# Patient Record
Sex: Female | Born: 1955 | Race: White | Hispanic: No | Smoking: Never smoker
Health system: Southern US, Community
[De-identification: ages and names within clinical notes are randomized; demographics above are authoritative.]

## PROBLEM LIST (undated history)

## (undated) DIAGNOSIS — H9319 Tinnitus, unspecified ear: Secondary | ICD-10-CM

## (undated) DIAGNOSIS — F419 Anxiety disorder, unspecified: Secondary | ICD-10-CM

## (undated) DIAGNOSIS — G4730 Sleep apnea, unspecified: Secondary | ICD-10-CM

## (undated) DIAGNOSIS — B0230 Zoster ocular disease, unspecified: Secondary | ICD-10-CM

## (undated) DIAGNOSIS — E559 Vitamin D deficiency, unspecified: Secondary | ICD-10-CM

## (undated) DIAGNOSIS — G43909 Migraine, unspecified, not intractable, without status migrainosus: Secondary | ICD-10-CM

## (undated) DIAGNOSIS — B019 Varicella without complication: Secondary | ICD-10-CM

## (undated) HISTORY — DX: Anxiety disorder, unspecified: F41.9

## (undated) HISTORY — DX: Migraine, unspecified, not intractable, without status migrainosus: G43.909

## (undated) HISTORY — DX: Zoster ocular disease, unspecified: B02.30

## (undated) HISTORY — DX: Vitamin D deficiency, unspecified: E55.9

## (undated) HISTORY — DX: Sleep apnea, unspecified: G47.30

## (undated) HISTORY — DX: Varicella without complication: B01.9

## (undated) HISTORY — DX: Tinnitus, unspecified ear: H93.19

---

## 1976-10-26 HISTORY — PX: APPENDECTOMY: SHX54

## 2004-10-26 HISTORY — PX: CHOLECYSTECTOMY, LAPAROSCOPIC: SHX56

## 2008-10-26 HISTORY — PX: ABLATION: SHX5711

## 2019-12-05 ENCOUNTER — Ambulatory Visit (INDEPENDENT_AMBULATORY_CARE_PROVIDER_SITE_OTHER): Payer: 59 | Admitting: Primary Care

## 2019-12-05 ENCOUNTER — Encounter: Payer: Self-pay | Admitting: Primary Care

## 2019-12-05 ENCOUNTER — Other Ambulatory Visit: Payer: Self-pay

## 2019-12-05 VITALS — BP 118/82 | HR 90 | Temp 97.0°F | Ht 65.0 in | Wt 184.2 lb

## 2019-12-05 DIAGNOSIS — H9313 Tinnitus, bilateral: Secondary | ICD-10-CM

## 2019-12-05 DIAGNOSIS — N898 Other specified noninflammatory disorders of vagina: Secondary | ICD-10-CM

## 2019-12-05 DIAGNOSIS — B0230 Zoster ocular disease, unspecified: Secondary | ICD-10-CM | POA: Diagnosis not present

## 2019-12-05 DIAGNOSIS — G609 Hereditary and idiopathic neuropathy, unspecified: Secondary | ICD-10-CM | POA: Diagnosis not present

## 2019-12-05 DIAGNOSIS — H9319 Tinnitus, unspecified ear: Secondary | ICD-10-CM | POA: Insufficient documentation

## 2019-12-05 DIAGNOSIS — E559 Vitamin D deficiency, unspecified: Secondary | ICD-10-CM | POA: Diagnosis not present

## 2019-12-05 LAB — COMPREHENSIVE METABOLIC PANEL
ALT: 11 U/L (ref 0–35)
AST: 14 U/L (ref 0–37)
Albumin: 4.3 g/dL (ref 3.5–5.2)
Alkaline Phosphatase: 67 U/L (ref 39–117)
BUN: 15 mg/dL (ref 6–23)
CO2: 31 mEq/L (ref 19–32)
Calcium: 9.3 mg/dL (ref 8.4–10.5)
Chloride: 103 mEq/L (ref 96–112)
Creatinine, Ser: 0.81 mg/dL (ref 0.40–1.20)
GFR: 71.47 mL/min (ref >60.00)
Glucose, Bld: 99 mg/dL (ref 70–99)
Potassium: 4.5 mEq/L (ref 3.5–5.1)
Sodium: 138 mEq/L (ref 135–145)
Total Bilirubin: 0.4 mg/dL (ref 0.2–1.2)
Total Protein: 6.6 g/dL (ref 6.0–8.3)

## 2019-12-05 LAB — LIPID PANEL
Cholesterol: 217 mg/dL — ABNORMAL HIGH (ref 0–200)
HDL: 74.2 mg/dL (ref >39.00)
LDL Cholesterol: 116 mg/dL — ABNORMAL HIGH (ref 0–99)
NonHDL: 142.52
Total CHOL/HDL Ratio: 3
Triglycerides: 133 mg/dL (ref 0.0–149.0)
VLDL: 26.6 mg/dL (ref 0.0–40.0)

## 2019-12-05 LAB — CBC
HCT: 42.4 % (ref 36.0–46.0)
Hemoglobin: 14.2 g/dL (ref 12.0–15.0)
MCHC: 33.4 g/dL (ref 30.0–36.0)
MCV: 96.5 fl (ref 78.0–100.0)
Platelets: 252 10*3/uL (ref 150.0–400.0)
RBC: 4.39 Mil/uL (ref 3.87–5.11)
RDW: 12.5 % (ref 11.5–15.5)
WBC: 5.4 10*3/uL (ref 4.0–10.5)

## 2019-12-05 LAB — VITAMIN D 25 HYDROXY (VIT D DEFICIENCY, FRACTURES): VITD: 74.19 ng/mL (ref 30.00–100.00)

## 2019-12-05 NOTE — Assessment & Plan Note (Signed)
Chronic, following with ophthalmology. Continue current regimen.

## 2019-12-05 NOTE — Assessment & Plan Note (Signed)
External. Chronic. Doing well on prn triamcinolone 0.5% cream. Continue to monitor.   Consider vaginal Estrace cream for atrophy causing itching.

## 2019-12-05 NOTE — Assessment & Plan Note (Signed)
Compliant to 5000 units daily. Repeat Vitamin D lab pending.

## 2019-12-05 NOTE — Progress Notes (Signed)
Subjective:    Patient ID: Chelsea Long, female    DOB: July 19, 1956, 64 y.o.   MRN: [id number]  HPI  This visit occurred during the SARS-CoV-2 public health emergency.  Safety protocols were in place, including screening questions prior to the visit, additional usage of staff PPE, and extensive cleaning of exam room while observing appropriate contact time as indicated for disinfecting solutions.   Chelsea Long is a 64 year old female who presents today to establish care and discuss the problems mentioned below. Will obtain/review records.  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 Currently managed on prednisolone and Combigan drops. Following with ophthalmology at [facility]. Also managed on daily oral valacyclovir 500 mg.   2) Labial Itching: Chronic and intermittent for years. Managed on triamcinolone 0.5% cream for which she is using as needed, 1-2 times weekly. She's not sure if she was diagnosed with lichen sclerosis. Overall doing well on current regimen.   3) Vitamin D Deficiency: Currently managed on 5000 units daily, previously managed on 10,000 units daily. No recent vitamin D check.  4) Tinnitus: Chronic over the last several months, mostly constant but waxes and wanes. Has noticed increased cerumen production and is able to dislodge on her own.   5) Peripheral Neuropathy: Diagnosed around 6 years ago, was tried on several medications which caused intolerable side effects. Several years ago she saw a homeopathic provider, lost weight, exercised and changed her diet with improvement.   Neuropathy starts at the top of her body and works down to her toes. Toes are most bothersome. She plans on working on weight loss through diet and exercise.   BP Readings from Last 3 Encounters:  12/05/19 118/82     Review of Systems  HENT: Positive for tinnitus.   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   Cardiovascular: Negative for chest pain.  Genitourinary:       Intermittent external vaginal itching  Neurological:       Chronic peripheral neuropathy        Past Medical History:  Diagnosis Date  . Chicken pox   . Herpes zoster of eye   . Migraines   . Tinnitus   . Vitamin D deficiency      Social History   Socioeconomic History  . Marital status: Widowed    Spouse name: Not on file  . Number of children: Not on file  . Years of education: Not on file  . Highest education level: Not on file  Occupational History  . Not on file  Tobacco Use  . Smoking status: Never Smoker  . Smokeless tobacco: Never Used  Substance and Sexual Activity  . Alcohol use: Yes  . Drug use: Not on file  . Sexual activity: Not on file  Other Topics Concern  . Not on file  Social History Narrative  . Not on file   Social Determinants of Health   Financial Resource Strain:   . Difficulty of Paying Living Expenses: Not on file  Food Insecurity:   . Worried About Charity fundraiser in the Last Year: Not on file  . Ran Out of Food in the Last Year: Not on file  Transportation Needs:   . Lack of Transportation (Medical): Not on file  . Lack of Transportation (Non-Medical): Not on file  Physical Activity:   .  Days of Exercise per Week: Not on file  . Minutes of Exercise per Session: Not on file  Stress:   . Feeling of Stress : Not on file  Social Connections:   . Frequency of Communication with Friends and Family: Not on file  . Frequency of Social Gatherings with Friends and Family: Not on file  . Attends Religious Services: Not on file  . Active Member of Clubs or Organizations: Not on file  . Attends Archivist Meetings: Not on file  . Marital Status: Not on file  Intimate Partner Violence:   . Fear of Current or Ex-Partner: Not on file  . Emotionally Abused: Not on file  . Physically Abused: Not on file  . Sexually Abused: Not on  file     No family history on file.  Allergies  Allergen Reactions  . Codeine   . Erythromycin   . Hydroquinidine     Current Outpatient Medications on File Prior to Visit  Medication Sig Dispense Refill  . Brimonidine Tartrate-Timolol (COMBIGAN OP) Apply 1 drop to eye 2 (two) times daily.    . Omega-3 Fatty Acids (FISH OIL) 1200 MG CAPS Take by mouth.    Marland Kitchen PREDNISOLONE ACETATE OP Apply 1 drop to eye every other day.    . triamcinolone cream (KENALOG) 0.5 % Apply 1 application topically as needed.    . valACYclovir (VALTREX) 500 MG tablet Take 500 mg by mouth daily.    Marland Kitchen VITAMIN D PO Take 5,000 Units by mouth daily.     No current facility-administered medications on file prior to visit.    BP 118/82   Pulse 90   Temp (!) 97 F (36.1 C) (Temporal)   Ht 5\' 5"  (1.651 m)   Wt 184 lb 4 oz (83.6 kg)   SpO2 100%   BMI 30.66 kg/m    Objective:   Physical Exam  Constitutional: She appears well-nourished.  HENT:  Right Ear: Tympanic membrane and ear canal normal.  Left Ear: Tympanic membrane and ear canal normal.  Cardiovascular: Normal rate and regular rhythm.  Respiratory: Effort normal and breath sounds normal.  Musculoskeletal:     Cervical back: Neck supple.  Skin: Skin is warm and dry.  Psychiatric: She has a normal mood and affect.           Assessment & Plan:

## 2019-12-05 NOTE — Patient Instructions (Signed)
Stop by the lab prior to leaving today. I will notify you of your results once received.   Try using Flonase (fluticasone) nasal spray for the Tinnitus. Instill 1 spray in each nostril twice daily.   It was a pleasure to meet you today! Please don't hesitate to call or message me with any questions. Welcome to [facility]!

## 2019-12-05 NOTE — Assessment & Plan Note (Addendum)
Bilaterally. Ear exam today benign. Recommended to try Flonase. Discussed that tinnitus may never resolve. Consider hearing specialist if needed.

## 2019-12-05 NOTE — Assessment & Plan Note (Signed)
Chronic, typically manages with dietary control and weight loss. She plans on working on both diet and exercise. Could not tolerate medications.

## 2020-01-09 ENCOUNTER — Telehealth: Payer: Self-pay | Admitting: *Deleted

## 2020-01-09 NOTE — Telephone Encounter (Signed)
Please notify patient that she should be just fine to have the Covid-19 vaccine. We don't recommend this vaccine if she's had anaphylaxis (severe shortness of breath/throat closure/full body hives) to other vaccines or medications.

## 2020-01-09 NOTE — Telephone Encounter (Signed)
Spoken and notified patient of Kate Clark's comments. Patient verbalized understanding.  

## 2020-01-09 NOTE — Telephone Encounter (Signed)
Patient left a voicemail stating that she is scheduled for the covid vaccine Monday. Patient stated that she would like Allie Bossier NP to review her allergies and confirm that it is okay for her to get the vaccine. Patient stated that she has had reactions to medications such as rash, nausea, dizziness and vertigo.

## 2020-03-21 DIAGNOSIS — Z1231 Encounter for screening mammogram for malignant neoplasm of breast: Secondary | ICD-10-CM

## 2020-03-21 NOTE — Telephone Encounter (Signed)
Chelsea Mare, do you see her colonoscopy report? I made a "sticky note" to myself that it's due in 2025 but I cannot find the report. Also, can you change her "[name]" to "[name]"?

## 2020-03-22 NOTE — Telephone Encounter (Signed)
Found the information in media. Also change patient prefer name

## 2020-03-29 ENCOUNTER — Ambulatory Visit
Admission: RE | Admit: 2020-03-29 | Discharge: 2020-03-29 | Disposition: A | Discharge status: Home / Self Care | Payer: 59 | Source: Ambulatory Visit | Attending: Primary Care | Admitting: Primary Care

## 2020-03-29 DIAGNOSIS — Z1231 Encounter for screening mammogram for malignant neoplasm of breast: Secondary | ICD-10-CM | POA: Diagnosis present

## 2020-10-03 ENCOUNTER — Ambulatory Visit: Payer: 59 | Admitting: Primary Care

## 2020-12-17 ENCOUNTER — Ambulatory Visit: Payer: Self-pay

## 2020-12-19 ENCOUNTER — Ambulatory Visit: Payer: 59 | Admitting: Primary Care

## 2021-01-02 ENCOUNTER — Other Ambulatory Visit: Payer: Self-pay

## 2021-01-02 ENCOUNTER — Ambulatory Visit (INDEPENDENT_AMBULATORY_CARE_PROVIDER_SITE_OTHER): Payer: 59 | Admitting: Primary Care

## 2021-01-02 ENCOUNTER — Encounter: Payer: Self-pay | Admitting: Primary Care

## 2021-01-02 VITALS — BP 118/62 | HR 82 | Temp 97.7°F | Ht 65.0 in | Wt 182.0 lb

## 2021-01-02 DIAGNOSIS — G609 Hereditary and idiopathic neuropathy, unspecified: Secondary | ICD-10-CM | POA: Diagnosis not present

## 2021-01-02 DIAGNOSIS — B0230 Zoster ocular disease, unspecified: Secondary | ICD-10-CM | POA: Diagnosis not present

## 2021-01-02 DIAGNOSIS — G4733 Obstructive sleep apnea (adult) (pediatric): Secondary | ICD-10-CM | POA: Insufficient documentation

## 2021-01-02 DIAGNOSIS — Z114 Encounter for screening for human immunodeficiency virus [HIV]: Secondary | ICD-10-CM

## 2021-01-02 DIAGNOSIS — G4730 Sleep apnea, unspecified: Secondary | ICD-10-CM | POA: Insufficient documentation

## 2021-01-02 DIAGNOSIS — E559 Vitamin D deficiency, unspecified: Secondary | ICD-10-CM

## 2021-01-02 DIAGNOSIS — Z1231 Encounter for screening mammogram for malignant neoplasm of breast: Secondary | ICD-10-CM

## 2021-01-02 DIAGNOSIS — Z23 Encounter for immunization: Secondary | ICD-10-CM | POA: Diagnosis not present

## 2021-01-02 DIAGNOSIS — Z1159 Encounter for screening for other viral diseases: Secondary | ICD-10-CM

## 2021-01-02 DIAGNOSIS — Z0000 Encounter for general adult medical examination without abnormal findings: Secondary | ICD-10-CM

## 2021-01-02 HISTORY — DX: Encounter for general adult medical examination without abnormal findings: Z00.00

## 2021-01-02 NOTE — Assessment & Plan Note (Signed)
Stable, continues to following with ophthalmology. Discussed Shingrix vaccine, she will double check with her ophthalmologist.

## 2021-01-02 NOTE — Assessment & Plan Note (Addendum)
Symptoms suspicious for sleep apnea.  Epworth Sleepiness Score of 10 today.  Referral placed to pulmonology.

## 2021-01-02 NOTE — Progress Notes (Signed)
Subjective:    Patient ID: Chelsea Long, female    DOB: Apr 06, 1956, 65 y.o.   MRN: [id number]  HPI  Chelsea Long is a very pleasant 65 y.o. female who presents today for complete physical.  She would also like to discuss suspicion of sleep apnea. For years she's noticed waking up from sleep, gasping, feels like she can't catch her breath. Intermittent. Lives alone now, but has been told by her late husband that she snores. She's never undergone a sleep study.   Immunizations: -Tetanus: 2010, due today -Influenza: Completed this season -Covid-19: Completed three vaccines -Shingles: Never completed   Diet: She endorses a poor diet.  Exercise: She is not exercising   Eye exam: Due later this year.  Dental exam: Completes semi-annually   Pap Smear: October 2019, due in October 2022 Mammogram: June 2021 Colonoscopy: UTD, due in 2025  BP Readings from Last 3 Encounters:  01/02/21 118/62  12/05/19 118/82   Wt Readings from Last 3 Encounters:  01/02/21 182 lb (82.6 kg)  12/05/19 184 lb 4 oz (83.6 kg)     Review of Systems  Constitutional: Negative for unexpected weight change.  HENT: Negative for rhinorrhea.   Eyes: Negative for visual disturbance.  Respiratory: Negative for cough and shortness of breath.        Waking during the night gasping for air.  Cardiovascular: Negative for chest pain.  Gastrointestinal: Negative for constipation and diarrhea.  Genitourinary: Negative for difficulty urinating.  Musculoskeletal: Negative for arthralgias.  Skin: Negative for rash.  Allergic/Immunologic: Negative for environmental allergies.  Neurological: Negative for dizziness, numbness and headaches.  Psychiatric/Behavioral:       Chronic anxiety, overall manages well on her own.          Past Medical History:  Diagnosis Date  . Chicken pox   . Herpes zoster of eye   . Migraines   . Tinnitus   . Vitamin D deficiency     Social History   Socioeconomic History   . Marital status: Widowed    Spouse name: Not on file  . Number of children: Not on file  . Years of education: Not on file  . Highest education level: Not on file  Occupational History  . Not on file  Tobacco Use  . Smoking status: Never Smoker  . Smokeless tobacco: Never Used  Substance and Sexual Activity  . Alcohol use: Yes  . Drug use: Not on file  . Sexual activity: Not on file  Other Topics Concern  . Not on file  Social History Narrative  . Not on file   Social Determinants of Health   Financial Resource Strain: Not on file  Food Insecurity: Not on file  Transportation Needs: Not on file  Physical Activity: Not on file  Stress: Not on file  Social Connections: Not on file  Intimate Partner Violence: Not on file    Past Surgical History:  Procedure Laterality Date  . ABLATION  2010   Uterine  . APPENDECTOMY  1978  . CHOLECYSTECTOMY, LAPAROSCOPIC  2006    Family History  Problem Relation Age of Onset  . Cancer Mother   . Dementia Mother   . Breast cancer Mother 77  . Stroke Father   . Hypertension Father   . Heart attack Father   . Hyperlipidemia Father   . Diabetes Father   . Cancer Maternal Grandmother   . Cancer Maternal Grandfather     Allergies  Allergen Reactions  . Codeine   .  Erythromycin   . Gabapentin Other (See Comments)  . Hydroquinidine   . Tobramycin     Current Outpatient Medications on File Prior to Visit  Medication Sig Dispense Refill  . Brimonidine Tartrate-Timolol (COMBIGAN OP) Apply 1 drop to eye 2 (two) times daily.    Marland Kitchen PREDNISOLONE ACETATE OP Apply 1 drop to eye every other day.    . timolol (TIMOPTIC) 0.5 % ophthalmic solution 1 drop 2 (two) times daily.    Marland Kitchen triamcinolone cream (KENALOG) 0.5 % Apply 1 application topically as needed.    . valACYclovir (VALTREX) 500 MG tablet Take 500 mg by mouth daily.    Marland Kitchen VITAMIN D PO Take 5,000 Units by mouth daily.     No current facility-administered medications on file prior  to visit.    There were no vitals taken for this visit. Objective:   Physical Exam HENT:     Right Ear: Tympanic membrane and ear canal normal.     Left Ear: Tympanic membrane and ear canal normal.     Nose: Nose normal.  Eyes:     Conjunctiva/sclera: Conjunctivae normal.     Pupils: Pupils are equal, round, and reactive to light.  Neck:     Thyroid: No thyromegaly.  Cardiovascular:     Rate and Rhythm: Normal rate and regular rhythm.     Heart sounds: No murmur heard.   Pulmonary:     Effort: Pulmonary effort is normal.     Breath sounds: Normal breath sounds. No rales.  Abdominal:     General: Bowel sounds are normal.     Palpations: Abdomen is soft.     Tenderness: There is no abdominal tenderness.  Musculoskeletal:        General: Normal range of motion.     Cervical back: Neck supple.  Lymphadenopathy:     Cervical: No cervical adenopathy.  Skin:    General: Skin is warm and dry.     Findings: No rash.  Neurological:     Mental Status: She is alert and oriented to person, place, and time.     Cranial Nerves: No cranial nerve deficit.     Deep Tendon Reflexes: Reflexes are normal and symmetric.  Psychiatric:        Mood and Affect: Mood normal.           Assessment & Plan:      This visit occurred during the SARS-CoV-2 public health emergency.  Safety protocols were in place, including screening questions prior to the visit, additional usage of staff PPE, and extensive cleaning of exam room while observing appropriate contact time as indicated for disinfecting solutions.

## 2021-01-02 NOTE — Patient Instructions (Addendum)
Stop by the lab prior to leaving today. I will notify you of your results once received.   You will be contacted regarding your referral to pulmonology.  Please let us know if you have not been contacted within two weeks.   Start exercising. You should be getting 150 minutes of moderate intensity exercise weekly.  It's important to improve your diet by reducing consumption of fast food, fried food, processed snack foods, sugary drinks. Increase consumption of fresh vegetables and fruits, whole grains, water.  Ensure you are drinking 64 ounces of water daily.  It was a pleasure to see you today!        Tdap (Tetanus, Diphtheria, Pertussis) Vaccine: What You Need to Know 1. Why get vaccinated? Tdap vaccine can prevent tetanus, diphtheria, and pertussis. Diphtheria and pertussis spread from person to person. Tetanus enters the body through cuts or wounds.  TETANUS (T) causes painful stiffening of the muscles. Tetanus can lead to serious health problems, including being unable to open the mouth, having trouble swallowing and breathing, or death.  DIPHTHERIA (D) can lead to difficulty breathing, heart failure, paralysis, or death.  PERTUSSIS (aP), also known as "whooping cough," can cause uncontrollable, violent coughing that makes it hard to breathe, eat, or drink. Pertussis can be extremely serious especially in babies and young children, causing pneumonia, convulsions, brain damage, or death. In teens and adults, it can cause weight loss, loss of bladder control, passing out, and rib fractures from severe coughing. 2. Tdap vaccine Tdap is only for children 7 years and older, adolescents, and adults.  Adolescents should receive a single dose of Tdap, preferably at age 68 or 75 years. Pregnant people should get a dose of Tdap during every pregnancy, preferably during the early part of the third trimester, to help protect the newborn from pertussis. Infants are most at risk for severe,  life-threatening complications from pertussis. Adults who have never received Tdap should get a dose of Tdap. Also, adults should receive a booster dose of either Tdap or Td (a different vaccine that protects against tetanus and diphtheria but not pertussis) every 10 years, or after 5 years in the case of a severe or dirty wound or burn. Tdap may be given at the same time as other vaccines. 3. Talk with your health care provider Tell your vaccine provider if the person getting the vaccine:  Has had an allergic reaction after a previous dose of any vaccine that protects against tetanus, diphtheria, or pertussis, or has any severe, life-threatening allergies  Has had a coma, decreased level of consciousness, or prolonged seizures within 7 days after a previous dose of any pertussis vaccine (DTP, DTaP, or Tdap)  Has seizures or another nervous system problem  Has ever had Guillain-Barr Syndrome (also called "GBS")  Has had severe pain or swelling after a previous dose of any vaccine that protects against tetanus or diphtheria In some cases, your health care provider may decide to postpone Tdap vaccination until a future visit. People with minor illnesses, such as a cold, may be vaccinated. People who are moderately or severely ill should usually wait until they recover before getting Tdap vaccine.  Your health care provider can give you more information. 4. Risks of a vaccine reaction  Pain, redness, or swelling where the shot was given, mild fever, headache, feeling tired, and nausea, vomiting, diarrhea, or stomachache sometimes happen after Tdap vaccination. People sometimes faint after medical procedures, including vaccination. Tell your provider if you feel dizzy or  have vision changes or ringing in the ears.  As with any medicine, there is a very remote chance of a vaccine causing a severe allergic reaction, other serious injury, or death. 5. What if there is a serious problem? An  allergic reaction could occur after the vaccinated person leaves the clinic. If you see signs of a severe allergic reaction (hives, swelling of the face and throat, difficulty breathing, a fast heartbeat, dizziness, or weakness), call 9-1-1 and get the person to the nearest hospital. For other signs that concern you, call your health care provider.  Adverse reactions should be reported to the Vaccine Adverse Event Reporting System (VAERS). Your health care provider will usually file this report, or you can do it yourself. Visit the VAERS website at www.vaers.SamedayNews.es or call (331)046-2775. VAERS is only for reporting reactions, and VAERS staff members do not give medical advice. 6. The National Vaccine Injury Compensation Program The [employer] Vaccine Injury Compensation Program (VICP) is a federal program that was created to compensate people who may have been injured by certain vaccines. Claims regarding alleged injury or death due to vaccination have a time limit for filing, which may be as short as two years. Visit the VICP website at GoldCloset.com.ee or call 539-829-7508 to learn about the program and about filing a claim. 7. How can I learn more?  Ask your health care provider.  Call your local or state health department.  Visit the website of the Food and Drug Administration (FDA) for vaccine package inserts and additional information at TraderRating.uy.  Contact the Centers for Disease Control and Prevention (CDC): ? Call (272)161-9163 (1-800-CDC-INFO) or ? Visit CDC's website at http://hunter.com/. Vaccine Information Statement Tdap (Tetanus, Diphtheria, Pertussis) Vaccine (05/31/2020) This information is not intended to replace advice given to you by your health care provider. Make sure you discuss any questions you have with your health care provider. Document Revised: 06/26/2020 Document Reviewed: 06/26/2020 Elsevier Patient Education   2021 [employer].

## 2021-01-02 NOTE — Assessment & Plan Note (Signed)
Stable, doing well. Will work on diet and exercise to maintain.

## 2021-01-02 NOTE — Assessment & Plan Note (Addendum)
Compliant to vitamin D3 5000 IU daily.  Repeat vitamin D level pending.  Add calcium 1200 mg daily.

## 2021-01-02 NOTE — Assessment & Plan Note (Signed)
Tetanus provided today. Due for Shingrix, she will speak with ophthalmologist.  Pap smear UTD, due next year. Mammogram due in Summer 2022, order placed. Colonoscopy due in 2025.  Discussed the importance of a healthy diet and regular exercise in order for weight loss, and to reduce the risk of any potential medical problems.  Exam today unremarkable. Labs pending.

## 2021-01-03 LAB — COMPREHENSIVE METABOLIC PANEL
ALT: 14 U/L (ref 0–35)
AST: 15 U/L (ref 0–37)
Albumin: 4.3 g/dL (ref 3.5–5.2)
Alkaline Phosphatase: 70 U/L (ref 39–117)
BUN: 14 mg/dL (ref 6–23)
CO2: 28 mEq/L (ref 19–32)
Calcium: 9.5 mg/dL (ref 8.4–10.5)
Chloride: 103 mEq/L (ref 96–112)
Creatinine, Ser: 0.93 mg/dL (ref 0.40–1.20)
GFR: 65.25 mL/min (ref >60.00)
Glucose, Bld: 89 mg/dL (ref 70–99)
Potassium: 5.1 mEq/L (ref 3.5–5.1)
Sodium: 139 mEq/L (ref 135–145)
Total Bilirubin: 0.5 mg/dL (ref 0.2–1.2)
Total Protein: 6.6 g/dL (ref 6.0–8.3)

## 2021-01-03 LAB — HEPATITIS C ANTIBODY
Hepatitis C Ab: NONREACTIVE
SIGNAL TO CUT-OFF: 0 (ref <1.00)

## 2021-01-03 LAB — LIPID PANEL
Cholesterol: 209 mg/dL — ABNORMAL HIGH (ref 0–200)
HDL: 80.9 mg/dL (ref >39.00)
LDL Cholesterol: 105 mg/dL — ABNORMAL HIGH (ref 0–99)
NonHDL: 127.82
Total CHOL/HDL Ratio: 3
Triglycerides: 116 mg/dL (ref 0.0–149.0)
VLDL: 23.2 mg/dL (ref 0.0–40.0)

## 2021-01-03 LAB — HIV ANTIBODY (ROUTINE TESTING W REFLEX): HIV 1&2 Ab, 4th Generation: NONREACTIVE

## 2021-01-03 LAB — VITAMIN D 25 HYDROXY (VIT D DEFICIENCY, FRACTURES): VITD: 90.19 ng/mL (ref 30.00–100.00)

## 2021-01-09 ENCOUNTER — Other Ambulatory Visit: Payer: Self-pay | Admitting: Primary Care

## 2021-01-09 DIAGNOSIS — D229 Melanocytic nevi, unspecified: Secondary | ICD-10-CM

## 2021-01-13 NOTE — Addendum Note (Signed)
Addended by: Francella Solian on: 01/13/2021 08:07 AM   Modules accepted: Orders

## 2021-02-04 ENCOUNTER — Other Ambulatory Visit: Payer: Self-pay

## 2021-02-04 ENCOUNTER — Ambulatory Visit (INDEPENDENT_AMBULATORY_CARE_PROVIDER_SITE_OTHER): Payer: 59 | Admitting: Primary Care

## 2021-02-04 ENCOUNTER — Encounter: Payer: Self-pay | Admitting: Primary Care

## 2021-02-04 VITALS — BP 114/82 | HR 81 | Temp 98.2°F | Ht 63.27 in | Wt 183.2 lb

## 2021-02-04 DIAGNOSIS — R0683 Snoring: Secondary | ICD-10-CM | POA: Diagnosis not present

## 2021-02-04 DIAGNOSIS — G4733 Obstructive sleep apnea (adult) (pediatric): Secondary | ICD-10-CM

## 2021-02-04 NOTE — Progress Notes (Signed)
@Patient  ID: Chelsea Long, female    DOB: Aug 25, 1956, 65 y.o.   MRN: [id number]  Chief Complaint  Patient presents with  . Consult    Has caught self waking up in the night trying to catch breath, snoring.    Referring provider: Pleas Koch, NP  HPI: 65 year old female, never smoked.  Past medical history significant for vitamin D deficiency, idiopathic peripheral neuropathy.  Patient referred to [facility] pulmonary for sleep consult by Loma Boston, NP d/t snoring.  02/04/2021 Patient presents today for sleep consult due to snoring.  Patient reports symptoms of snoring, choking or struggling for breath during sleep, restless sleep, daytime fatigue.  She is widowed. Last year she noted several times where she woke up gasping for air.  She has been sleeping with her head of bed elevated with an adjustable bed frame.  This seems to have eliminated possible apnea she was experiencing.  Normal bedtime is between 10 PM and 12 AM.  She wakes up on average 1-3 times at night.  She gets out of bed at around 7 to 7:30 in the morning. She is often tired during the day needing to take a nap in the afternoon.  She retired in 2020.  She gets some daily movement by walking.  She babysits her grandchildren twice a week. No symptoms of narcoplepsy, sleep walking or cataplexy.    Sleep questionnaire Symptoms-Snoring, restless sleep, daytime fatigue  Prior sleep study- None  Bedtime- 10pm-12am  Time to fall asleep- Varies; <1 hour Nocturnal awakenings- 1-3 Out of bed morning- 7-7:30am Epworth-8  Allergies  Allergen Reactions  . Codeine   . Erythromycin   . Gabapentin Other (See Comments)  . Hydroquinidine   . Tobramycin   02/04/2021   Immunization History  Administered Date(s) Administered  . Influenza Whole 08/09/2020  . PFIZER(Purple Top)SARS-COV-2 Vaccination 01/15/2020, 02/12/2020, 10/11/2020  . Tdap 05/30/2009, 01/02/2021    Past Medical History:  Diagnosis Date  . Chicken  pox   . Herpes zoster of eye   . Migraines   . Tinnitus   . Vitamin D deficiency     Tobacco History: Social History   Tobacco Use  Smoking Status Never Smoker  Smokeless Tobacco Never Used   Counseling given: Not Answered   Outpatient Medications Prior to Visit  Medication Sig Dispense Refill  . PREDNISOLONE ACETATE OP Apply 1 drop to eye every other day.    . timolol (TIMOPTIC) 0.5 % ophthalmic solution 1 drop 2 (two) times daily.    Marland Kitchen triamcinolone cream (KENALOG) 0.5 % Apply 1 application topically as needed.    . valACYclovir (VALTREX) 500 MG tablet Take 500 mg by mouth daily.    Marland Kitchen VITAMIN D PO Take 5,000 Units by mouth daily.     No facility-administered medications prior to visit.   Review of Systems  Review of Systems  Constitutional: Positive for fatigue.  HENT: Negative.   Respiratory: Negative.   Cardiovascular: Negative.   Psychiatric/Behavioral: Positive for sleep disturbance.     Physical Exam  BP 114/82 (BP Location: Left Arm, Patient Position: Sitting, Cuff Size: Normal)   Pulse 81   Temp 98.2 F (36.8 C) (Temporal)   Ht 5' 3.27" (1.607 m)   Wt 183 lb 3.2 oz (83.1 kg)   SpO2 99%   BMI 32.18 kg/m  Physical Exam Constitutional:      Appearance: Normal appearance.  HENT:     Mouth/Throat:     Mouth: Mucous membranes are moist.  Pharynx: Oropharynx is clear.  Cardiovascular:     Rate and Rhythm: Normal rate and regular rhythm.  Pulmonary:     Effort: Pulmonary effort is normal.     Breath sounds: Normal breath sounds.  Musculoskeletal:        General: Normal range of motion.  Skin:    General: Skin is warm and dry.  Neurological:     General: No focal deficit present.     Mental Status: She is alert and oriented to person, place, and time. Mental status is at baseline.  Psychiatric:        Mood and Affect: Mood normal.        Behavior: Behavior normal.        Thought Content: Thought content normal.        Judgment: Judgment  normal.      Lab Results:  CBC    Component Value Date/Time   WBC 5.4 12/05/2019 1217   RBC 4.39 12/05/2019 1217   HGB 14.2 12/05/2019 1217   HCT 42.4 12/05/2019 1217   PLT 252.0 12/05/2019 1217   MCV 96.5 12/05/2019 1217   MCHC 33.4 12/05/2019 1217   RDW 12.5 12/05/2019 1217    BMET    Component Value Date/Time   NA 139 01/02/2021 1447   K 5.1 01/02/2021 1447   CL 103 01/02/2021 1447   CO2 28 01/02/2021 1447   GLUCOSE 89 01/02/2021 1447   BUN 14 01/02/2021 1447   CREATININE 0.93 01/02/2021 1447   CALCIUM 9.5 01/02/2021 1447    BNP No results found for: BNP  ProBNP No results found for: PROBNP  Imaging: No results found.   Assessment & Plan:   Snoring - Patient reports symptoms of snoring, apnea, daytime fatigue. Family hx sleep apnea. Epworth 8. Needs home sleep study to evaluate for possible underlying OSA.  - We reviewed risk for untreated sleep apnea including cardiovascular disease, cardiac arrhthymias, stroke, diabetes, pulmonary HTN and risk for daytime accidents. We also reviewed treatment options including oral appliance, CPAP or referral to ENT.  - Encourage patient to maintain healthy weight and recommended side sleeping position or elevating head of bed  Orders: - Home sleep study  re: snoring  Follow-up: - 4 week televisit to review sleep study     Martyn Ehrich, NP 02/04/2021

## 2021-02-04 NOTE — Patient Instructions (Addendum)
Recommendations: - Maintain healthy weight - Encourage side sleeping position or elevating head of bed  Orders: - Home sleep study  re: snoring  Follow-up: - 4 week televisit to review sleep study    Sleep Apnea Sleep apnea affects breathing during sleep. It causes breathing to stop for a short time or to become shallow. It can also increase the risk of:  Heart attack.  Stroke.  Being very overweight (obese).  Diabetes.  Heart failure.  Irregular heartbeat. The goal of treatment is to help you breathe normally again. What are the causes? There are three kinds of sleep apnea:  Obstructive sleep apnea. This is caused by a blocked or collapsed airway.  Central sleep apnea. This happens when the brain does not send the right signals to the muscles that control breathing.  Mixed sleep apnea. This is a combination of obstructive and central sleep apnea. The most common cause of this condition is a collapsed or blocked airway. This can happen if:  Your throat muscles are too relaxed.  Your tongue and tonsils are too large.  You are overweight.  Your airway is too small.   What increases the risk?  Being overweight.  Smoking.  Having a small airway.  Being older.  Being female.  Drinking alcohol.  Taking medicines to calm yourself (sedatives or tranquilizers).  Having family members with the condition. What are the signs or symptoms?  Trouble staying asleep.  Being sleepy or tired during the day.  Getting angry a lot.  Loud snoring.  Headaches in the morning.  Not being able to focus your mind (concentrate).  Forgetting things.  Less interest in sex.  Mood swings.  Personality changes.  Feelings of sadness (depression).  Waking up a lot during the night to pee (urinate).  Dry mouth.  Sore throat. How is this diagnosed?  Your medical history.  A physical exam.  A test that is done when you are sleeping (sleep study). The test is most  often done in a sleep lab but may also be done at home. How is this treated?  Sleeping on your side.  Using a medicine to get rid of mucus in your nose (decongestant).  Avoiding the use of alcohol, medicines to help you relax, or certain pain medicines (narcotics).  Losing weight, if needed.  Changing your diet.  Not smoking.  Using a machine to open your airway while you sleep, such as: ? An oral appliance. This is a mouthpiece that shifts your lower jaw forward. ? A CPAP device. This device blows air through a mask when you breathe out (exhale). ? An EPAP device. This has valves that you put in each nostril. ? A BPAP device. This device blows air through a mask when you breathe in (inhale) and breathe out.  Having surgery if other treatments do not work. It is important to get treatment for sleep apnea. Without treatment, it can lead to:  High blood pressure.  Coronary artery disease.  In men, not being able to have an erection (impotence).  Reduced thinking ability.   Follow these instructions at home: Lifestyle  Make changes that your doctor recommends.  Eat a healthy diet.  Lose weight if needed.  Avoid alcohol, medicines to help you relax, and some pain medicines.  Do not use any products that contain nicotine or tobacco, such as cigarettes, e-cigarettes, and chewing tobacco. If you need help quitting, ask your doctor. General instructions  Take over-the-counter and prescription medicines only as told  by your doctor.  If you were given a machine to use while you sleep, use it only as told by your doctor.  If you are having surgery, make sure to tell your doctor you have sleep apnea. You may need to bring your device with you.  Keep all follow-up visits as told by your doctor. This is important. Contact a doctor if:  The machine that you were given to use during sleep bothers you or does not seem to be working.  You do not get better.  You get  worse. Get help right away if:  Your chest hurts.  You have trouble breathing in enough air.  You have an uncomfortable feeling in your back, arms, or stomach.  You have trouble talking.  One side of your body feels weak.  A part of your face is hanging down. These symptoms may be an emergency. Do not wait to see if the symptoms will go away. Get medical help right away. Call your local emergency services (911 in the [place] Do not drive yourself to the hospital. Summary  This condition affects breathing during sleep.  The most common cause is a collapsed or blocked airway.  The goal of treatment is to help you breathe normally while you sleep. This information is not intended to replace advice given to you by your health care provider. Make sure you discuss any questions you have with your health care provider. Document Revised: 07/29/2018 Document Reviewed: 06/07/2018 Elsevier Patient Education  [employer].

## 2021-02-04 NOTE — Assessment & Plan Note (Addendum)
-   Patient reports symptoms of snoring, apnea, daytime fatigue. Family hx sleep apnea. Epworth 8. Needs home sleep study to evaluate for possible underlying OSA.  - We reviewed risk for untreated sleep apnea including cardiovascular disease, cardiac arrhthymias, stroke, diabetes, pulmonary HTN and risk for daytime accidents. We also reviewed treatment options including oral appliance, CPAP or referral to ENT.  - Encourage patient to maintain healthy weight and recommended side sleeping position or elevating head of bed  Orders: - Home sleep study  re: snoring  Follow-up: - 4 week televisit to review sleep study

## 2021-02-05 NOTE — Progress Notes (Signed)
Reviewed and agree with assessment/plan.   Chesley Mires, MD [facility] Pulmonary/Critical Care 02/05/2021, 4:01 PM Pager:  [phone]

## 2021-03-04 ENCOUNTER — Other Ambulatory Visit: Payer: Self-pay

## 2021-03-04 ENCOUNTER — Ambulatory Visit: Payer: 59

## 2021-03-04 DIAGNOSIS — G4733 Obstructive sleep apnea (adult) (pediatric): Secondary | ICD-10-CM

## 2021-03-07 DIAGNOSIS — G4733 Obstructive sleep apnea (adult) (pediatric): Secondary | ICD-10-CM | POA: Diagnosis not present

## 2021-03-12 NOTE — Telephone Encounter (Signed)
Beth, please advise on sleep study results. Thanks

## 2021-03-12 NOTE — Telephone Encounter (Signed)
She has mild sleep apnea. I had recommended fu televisit in 4 weeks from her last visit to review study once back. Please set up

## 2021-03-13 ENCOUNTER — Ambulatory Visit (INDEPENDENT_AMBULATORY_CARE_PROVIDER_SITE_OTHER): Payer: 59

## 2021-03-13 ENCOUNTER — Other Ambulatory Visit: Payer: Self-pay

## 2021-03-13 DIAGNOSIS — Z23 Encounter for immunization: Secondary | ICD-10-CM | POA: Diagnosis not present

## 2021-03-13 NOTE — Progress Notes (Signed)
Per orders of Allie Bossier, NP, injection of shingrix in L deltoid given by Randall An. Patient tolerated injection well.

## 2021-04-04 ENCOUNTER — Other Ambulatory Visit: Payer: Self-pay

## 2021-04-04 ENCOUNTER — Ambulatory Visit
Admission: RE | Admit: 2021-04-04 | Discharge: 2021-04-04 | Disposition: A | Discharge status: Home / Self Care | Payer: 59 | Source: Ambulatory Visit | Attending: Primary Care | Admitting: Primary Care

## 2021-04-04 DIAGNOSIS — Z1231 Encounter for screening mammogram for malignant neoplasm of breast: Secondary | ICD-10-CM | POA: Insufficient documentation

## 2021-04-07 ENCOUNTER — Other Ambulatory Visit: Payer: Self-pay | Admitting: Primary Care

## 2021-04-07 DIAGNOSIS — N6489 Other specified disorders of breast: Secondary | ICD-10-CM

## 2021-04-07 DIAGNOSIS — N6320 Unspecified lump in the left breast, unspecified quadrant: Secondary | ICD-10-CM

## 2021-04-07 DIAGNOSIS — R928 Other abnormal and inconclusive findings on diagnostic imaging of breast: Secondary | ICD-10-CM

## 2021-04-09 ENCOUNTER — Ambulatory Visit: Payer: 59 | Admitting: Primary Care

## 2021-04-11 ENCOUNTER — Other Ambulatory Visit: Payer: Self-pay

## 2021-04-11 ENCOUNTER — Ambulatory Visit (INDEPENDENT_AMBULATORY_CARE_PROVIDER_SITE_OTHER): Payer: 59 | Admitting: Primary Care

## 2021-04-11 ENCOUNTER — Encounter: Payer: Self-pay | Admitting: Primary Care

## 2021-04-11 DIAGNOSIS — G4733 Obstructive sleep apnea (adult) (pediatric): Secondary | ICD-10-CM

## 2021-04-11 NOTE — Progress Notes (Signed)
Reviewed and agree with assessment/plan.   Chesley Mires, MD [facility] Pulmonary/Critical Care 04/11/2021, 12:13 PM Pager:  [phone]

## 2021-04-11 NOTE — Progress Notes (Signed)
Virtual Visit via Telephone Note  I connected with Chelsea Long on 04/11/21 at 10:00 AM EDT by telephone and verified that I am speaking with the correct person using two identifiers.  Location: Patient: Home Provider: Office    I discussed the limitations, risks, security and privacy concerns of performing an evaluation and management service by telephone and the availability of in person appointments. I also discussed with the patient that there may be a patient responsible charge related to this service. The patient expressed understanding and agreed to proceed.   History of Present Illness: 64 year old female, never smoked.  Past medical history significant for vitamin D deficiency, idiopathic peripheral neuropathy.  Patient referred to [facility] pulmonary for sleep consult by Loma Boston, NP d/t snoring.  Previous [facility] pulmonary encounter:  02/04/2021 Patient presents today for sleep consult due to snoring.  Patient reports symptoms of snoring, choking or struggling for breath during sleep, restless sleep, daytime fatigue.  She is widowed. Last year she noted several times where she woke up gasping for air.  She has been sleeping with her head of bed elevated with an adjustable bed frame.  This seems to have eliminated possible apnea she was experiencing.  Normal bedtime is between 10 PM and 12 AM.  She wakes up on average 1-3 times at night.  She gets out of bed at around 7 to 7:30 in the morning. She is often tired during the day needing to take a nap in the afternoon.  She retired in 2020.  She gets some daily movement by walking.  She babysits her grandchildren twice a week. No symptoms of narcoplepsy, sleep walking or cataplexy.    Sleep questionnaire Symptoms-Snoring, restless sleep, daytime fatigue  Prior sleep study- None  Bedtime- 10pm-12am  Time to fall asleep- Varies; <1 hour Nocturnal awakenings- 1-3 Out of bed morning- 7-7:30am Epworth-8  04/11/2021- Interim hx  Patient  contacted today to review home sleep study. Patient reports symptoms snoring, restless sleep, daytime fatigue. She has a family history of sleep apnea. HST showed mild to moderate OSA, average AHI 14.9/hr. We discussed treatment options including weight loss, oral appliance,  CPAP therapy or referral to ENT. She is interested in continuing weight loss efforts and referral to orthodontist for oral appliance.   Observations/Objective:  - Able to speak in full sentences; no overt shortness   Assessment and Plan:  Mild OSA: - Patient has symptoms of snoring, restless sleep and daytime fatigue  - 03/04/21 HST >> AHI 14.9, SpO2 low 84%  - We reviewed treatment options, patient is interested in working on weight loss and is open to trying oral appliance. She is hesitant to use CPAP.  - We have placed a referral to Dr. Augustina Mood for oral appliance consideration  - Advised patient not to drive if experiencing excessive daytime fatigue/somnolence   Follow Up Instructions:  - 6 months with APP or sooner if needed    I discussed the assessment and treatment plan with the patient. The patient was provided an opportunity to ask questions and all were answered. The patient agreed with the plan and demonstrated an understanding of the instructions.   The patient was advised to call back or seek an in-person evaluation if the symptoms worsen or if the condition fails to improve as anticipated.  I provided 22 minutes of non-face-to-face time during this encounter.   Martyn Ehrich, NP

## 2021-04-11 NOTE — Patient Instructions (Signed)
Home sleep study showed mild-moderate obstructive sleep apnea; you have on average 14.9 events an hour. Lowest oxygen went was 84% with average 92%   Recommendations: Work on maintaining healthy weight Do not drive if experiencing excessive daytime fatigue or somnolence   Referral:  Referring you to orthodontics - Dr. Augustina Mood [phone]   Follow-up: 6 months with [facility] NP / in office or televisit ok

## 2021-04-15 ENCOUNTER — Ambulatory Visit
Admission: RE | Admit: 2021-04-15 | Discharge: 2021-04-15 | Disposition: A | Discharge status: Home / Self Care | Payer: 59 | Source: Ambulatory Visit | Attending: Primary Care | Admitting: Primary Care

## 2021-04-15 ENCOUNTER — Other Ambulatory Visit: Payer: Self-pay

## 2021-04-15 DIAGNOSIS — N6489 Other specified disorders of breast: Secondary | ICD-10-CM | POA: Diagnosis present

## 2021-04-15 DIAGNOSIS — R928 Other abnormal and inconclusive findings on diagnostic imaging of breast: Secondary | ICD-10-CM | POA: Diagnosis not present

## 2021-04-15 DIAGNOSIS — N6320 Unspecified lump in the left breast, unspecified quadrant: Secondary | ICD-10-CM

## 2021-04-22 ENCOUNTER — Telehealth: Payer: Self-pay | Admitting: *Deleted

## 2021-04-22 NOTE — Telephone Encounter (Signed)
Noted  

## 2021-04-22 NOTE — Telephone Encounter (Signed)
Patient called stating that she is having a dull pain in her back close to her kidneys. Patient stated the left side is a little worse than the right. Patient denies a fever, urine urgency, frequency, burning or pain. Patient stated that the dull pain just started this morning. Patient stated that she is not aware of an injury to her back, but did move some furniture that was not heavy.  Patient was offered an appointment which she declined stating that she will give it 24 hours to see if it goes away. Patient was given UC/ER precautions and she verbalized understanding.

## 2021-08-21 NOTE — Telephone Encounter (Signed)
Called patient made nurse visit for both. No further action needed.

## 2021-08-28 ENCOUNTER — Ambulatory Visit (INDEPENDENT_AMBULATORY_CARE_PROVIDER_SITE_OTHER): Payer: 59

## 2021-08-28 ENCOUNTER — Other Ambulatory Visit: Payer: Self-pay

## 2021-08-28 DIAGNOSIS — Z23 Encounter for immunization: Secondary | ICD-10-CM

## 2021-09-04 ENCOUNTER — Other Ambulatory Visit: Payer: Self-pay

## 2021-09-04 ENCOUNTER — Encounter: Payer: Self-pay | Admitting: Primary Care

## 2021-09-04 ENCOUNTER — Ambulatory Visit (INDEPENDENT_AMBULATORY_CARE_PROVIDER_SITE_OTHER): Payer: 59 | Admitting: Primary Care

## 2021-09-04 VITALS — BP 110/74 | HR 76 | Temp 98.2°F | Ht 63.27 in | Wt 189.0 lb

## 2021-09-04 DIAGNOSIS — J029 Acute pharyngitis, unspecified: Secondary | ICD-10-CM | POA: Diagnosis not present

## 2021-09-04 LAB — POCT RAPID STREP A (OFFICE): Rapid Strep A Screen: NEGATIVE

## 2021-09-04 NOTE — Patient Instructions (Signed)
You can try some Zyrtec/Allegra/Claritin for drainage and cough.  Tylenol or Ibuprofen for sore throat and inflammation.  It was a pleasure to see you today!

## 2021-09-04 NOTE — Progress Notes (Signed)
Subjective:    Patient ID: Chelsea Long, female    DOB: May 08, 1956, 65 y.o.   MRN: [id number]  HPI  Chelsea Long is a very pleasant 65 y.o. female with a history of sleep apnea, tinnitus who presents today to discuss sore throat.  She completed a seond Shingrix and flu vaccine one week ago. The following day she developed a headache, fatigue. Since then she developed a sore throat, post nasal drip, cough (mostly in morning and evening).    She denies rhinorrhea, chills. She took a Covid-19 test 5 days ago and 2 days ago, both negative. She's been around her grandchildren who have been sick, she thinks they had an ear infection and strep throat, they are on antibiotic treatment.   She's not taken anything OTC for symptoms.   BP Readings from Last 3 Encounters:  09/04/21 110/74  02/04/21 114/82  01/02/21 118/62      Review of Systems  Constitutional:  Negative for chills, fatigue and fever.  HENT:  Positive for postnasal drip and sore throat.   Respiratory:  Positive for cough.   Musculoskeletal:  Negative for myalgias.        Past Medical History:  Diagnosis Date   Chicken pox    Herpes zoster of eye    Migraines    Tinnitus    Vitamin D deficiency     Social History   Socioeconomic History   Marital status: Widowed    Spouse name: Not on file   Number of children: Not on file   Years of education: Not on file   Highest education level: Not on file  Occupational History   Not on file  Tobacco Use   Smoking status: Never   Smokeless tobacco: Never  Vaping Use   Vaping Use: Never used  Substance and Sexual Activity   Alcohol use: Yes   Drug use: Not on file   Sexual activity: Not on file  Other Topics Concern   Not on file  Social History Narrative   Not on file   Social Determinants of Health   Financial Resource Strain: Not on file  Food Insecurity: Not on file  Transportation Needs: Not on file  Physical Activity: Not on file  Stress: Not  on file  Social Connections: Not on file  Intimate Partner Violence: Not on file    Past Surgical History:  Procedure Laterality Date   ABLATION  2010   Uterine   [place], LAPAROSCOPIC  2006    Family History  Problem Relation Age of Onset   Cancer Mother    Dementia Mother    Breast cancer Mother 71   Stroke Father    Hypertension Father    Heart attack Father    Hyperlipidemia Father    Diabetes Father    Cancer Maternal Grandmother    Cancer Maternal Grandfather     Allergies  Allergen Reactions   Codeine    Erythromycin    Gabapentin Other (See Comments)   Hydroquinidine    Tobramycin     Current Outpatient Medications on File Prior to Visit  Medication Sig Dispense Refill   Calcium Carbonate-Vit D-Min (CALCIUM 1200 PO) Take by mouth.     timolol (TIMOPTIC) 0.5 % ophthalmic solution 1 drop 2 (two) times daily.     triamcinolone cream (KENALOG) 0.5 % Apply 1 application topically as needed.     valACYclovir (VALTREX) 500 MG tablet Take 500 mg by mouth daily.  VITAMIN D PO Take 5,000 Units by mouth daily.     No current facility-administered medications on file prior to visit.    BP 110/74   Pulse 76   Temp 98.2 F (36.8 C) (Temporal)   Ht 5' 3.27" (1.607 m)   Wt 189 lb (85.7 kg)   SpO2 98%   BMI 33.19 kg/m  Objective:   Physical Exam HENT:     Right Ear: Tympanic membrane and ear canal normal.     Left Ear: Tympanic membrane and ear canal normal.     Nose:     Right Sinus: No maxillary sinus tenderness or frontal sinus tenderness.     Left Sinus: No maxillary sinus tenderness or frontal sinus tenderness.     Mouth/Throat:     Mouth: Mucous membranes are moist.     Pharynx: No oropharyngeal exudate or posterior oropharyngeal erythema.  Eyes:     Conjunctiva/sclera: Conjunctivae normal.  Cardiovascular:     Rate and Rhythm: Normal rate and regular rhythm.  Pulmonary:     Effort: Pulmonary effort is normal.      Breath sounds: Normal breath sounds. No wheezing or rales.  Musculoskeletal:     Cervical back: Neck supple.  Lymphadenopathy:     Cervical: No cervical adenopathy.  Skin:    General: Skin is warm and dry.          Assessment & Plan:      This visit occurred during the SARS-CoV-2 public health emergency.  Safety protocols were in place, including screening questions prior to the visit, additional usage of staff PPE, and extensive cleaning of exam room while observing appropriate contact time as indicated for disinfecting solutions.

## 2021-09-04 NOTE — Assessment & Plan Note (Signed)
Appears to be secondary to post nasal drip. Does not appear acutely ill.  Rapid strep negative, exam otherwise negative.  Discussed use of antihistamine.  Follow up PRN.

## 2021-10-13 NOTE — Telephone Encounter (Signed)
Will keep in triage box to follow back up and locate form.

## 2021-10-14 NOTE — Telephone Encounter (Signed)
Please advise if you have seen a form on this patient   NP Volanda Napoleon, I currently have an appliance for my sleep apnea from the office of Dr. Delia Chimes. They recently faxed and mailed a request for a prescription for the appliance to your office because my insurance is now requiring one. Could you please send over the prescription to their office.    Thank you, Chelsea Long

## 2021-10-14 NOTE — Telephone Encounter (Signed)
I will put in fax outbox this evening

## 2022-01-15 ENCOUNTER — Encounter: Payer: 59 | Admitting: Primary Care

## 2022-03-06 ENCOUNTER — Ambulatory Visit (INDEPENDENT_AMBULATORY_CARE_PROVIDER_SITE_OTHER): Payer: Medicare Other | Admitting: Primary Care

## 2022-03-06 VITALS — BP 120/72 | HR 74 | Temp 98.3°F | Ht 65.0 in | Wt 188.0 lb

## 2022-03-06 DIAGNOSIS — N898 Other specified noninflammatory disorders of vagina: Secondary | ICD-10-CM

## 2022-03-06 DIAGNOSIS — Z0000 Encounter for general adult medical examination without abnormal findings: Secondary | ICD-10-CM

## 2022-03-06 DIAGNOSIS — E785 Hyperlipidemia, unspecified: Secondary | ICD-10-CM

## 2022-03-06 DIAGNOSIS — Z23 Encounter for immunization: Secondary | ICD-10-CM | POA: Diagnosis not present

## 2022-03-06 DIAGNOSIS — Z1231 Encounter for screening mammogram for malignant neoplasm of breast: Secondary | ICD-10-CM

## 2022-03-06 DIAGNOSIS — Z136 Encounter for screening for cardiovascular disorders: Secondary | ICD-10-CM

## 2022-03-06 DIAGNOSIS — E2839 Other primary ovarian failure: Secondary | ICD-10-CM

## 2022-03-06 DIAGNOSIS — G4730 Sleep apnea, unspecified: Secondary | ICD-10-CM | POA: Diagnosis not present

## 2022-03-06 DIAGNOSIS — G609 Hereditary and idiopathic neuropathy, unspecified: Secondary | ICD-10-CM

## 2022-03-06 DIAGNOSIS — H40052 Ocular hypertension, left eye: Secondary | ICD-10-CM | POA: Insufficient documentation

## 2022-03-06 DIAGNOSIS — E559 Vitamin D deficiency, unspecified: Secondary | ICD-10-CM | POA: Diagnosis not present

## 2022-03-06 HISTORY — DX: Encounter for general adult medical examination without abnormal findings: Z00.00

## 2022-03-06 LAB — LIPID PANEL
Cholesterol: 224 mg/dL — ABNORMAL HIGH (ref 0–200)
HDL: 69.9 mg/dL (ref >39.00)
LDL Cholesterol: 137 mg/dL — ABNORMAL HIGH (ref 0–99)
NonHDL: 153.88
Total CHOL/HDL Ratio: 3
Triglycerides: 85 mg/dL (ref 0.0–149.0)
VLDL: 17 mg/dL (ref 0.0–40.0)

## 2022-03-06 LAB — COMPREHENSIVE METABOLIC PANEL
ALT: 11 U/L (ref 0–35)
AST: 14 U/L (ref 0–37)
Albumin: 4.3 g/dL (ref 3.5–5.2)
Alkaline Phosphatase: 67 U/L (ref 39–117)
BUN: 15 mg/dL (ref 6–23)
CO2: 29 mEq/L (ref 19–32)
Calcium: 9.4 mg/dL (ref 8.4–10.5)
Chloride: 102 mEq/L (ref 96–112)
Creatinine, Ser: 0.86 mg/dL (ref 0.40–1.20)
GFR: 71.09 mL/min (ref >60.00)
Glucose, Bld: 90 mg/dL (ref 70–99)
Potassium: 4.1 mEq/L (ref 3.5–5.1)
Sodium: 138 mEq/L (ref 135–145)
Total Bilirubin: 0.7 mg/dL (ref 0.2–1.2)
Total Protein: 6.7 g/dL (ref 6.0–8.3)

## 2022-03-06 LAB — VITAMIN D 25 HYDROXY (VIT D DEFICIENCY, FRACTURES): VITD: 74.42 ng/mL (ref 30.00–100.00)

## 2022-03-06 MED ORDER — CLOBETASOL PROPIONATE 0.05 % EX CREA: 1.0000 "application " | TOPICAL_CREAM | Freq: Two times a day (BID) | CUTANEOUS | 0 refills | Status: DC | PRN

## 2022-03-06 NOTE — Assessment & Plan Note (Signed)
Following with ophthalmology regularly. ?Continue timolol drops as prescribed ?

## 2022-03-06 NOTE — Assessment & Plan Note (Signed)
Stable.  No concerns today. Continue to monitor.  

## 2022-03-06 NOTE — Assessment & Plan Note (Signed)
Symptoms appear similar to lichen sclerosis, she believes she was diagnosed years ago. ? ?Offered GYN evaluation for further evaluation, she currently declines. ?Trial of clobetasol 0.05% cream sent to pharmacy to use twice daily as needed. ? ?She will update if no improvement. ?

## 2022-03-06 NOTE — Progress Notes (Addendum)
? ?Welcome to [employer] Visit ? ?  ? ?Patient: Chelsea Long, Female    DOB: 1956-09-09, 66 y.o.   MRN: [id number] ? ?Subjective  ?Chief Complaint  ?Patient presents with  ? Medicare Wellness  ?  Welcome to medicare   ? ? ?Chelsea Long is a 66 y.o. female who presents today for her Annual Wellness Visit. ?She reports consuming a general diet. The patient does not participate in regular exercise at present. She generally feels fairly well. She reports sleeping fairly well. She does have additional problems to discuss today.  ? ?HPI ? ? ? ?She would also like to discuss vaginal and rectal itching. Chronic for years to vaginal region, dates back to teenage years. Over the last several months she's noticed increased vaginal itching. She was evaluated by her GYN last who prescribed triamcinolone 0.5%. She found improvement in itching when using triamcinolone. She's never tried clobetasol. She thinks she was diagnosed with lichen sclerosis, isn't sure.  ? ?Patient Active Problem List  ? Diagnosis Date Noted  ? Welcome to Medicare preventive visit 03/06/2022  ? Hyperlipidemia 03/06/2022  ? Increased pressure in the eye, left 03/06/2022  ? Snoring 02/04/2021  ? Preventative health care 01/02/2021  ? Sleep apnea 01/02/2021  ? Idiopathic peripheral neuropathy 12/05/2019  ? Vitamin D deficiency 12/05/2019  ? Tinnitus 12/05/2019  ? Vaginal itching 12/05/2019  ? ?Past Medical History:  ?Diagnosis Date  ? Chicken pox   ? Herpes zoster of eye   ? Migraines   ? Tinnitus   ? Vitamin D deficiency   ? ?Past Surgical History:  ?Procedure Laterality Date  ? ABLATION  2010  ? Uterine  ? APPENDECTOMY  1978  ? CHOLECYSTECTOMY, LAPAROSCOPIC  2006  ? ?Social History  ? ?Socioeconomic History  ? Marital status: Widowed  ?  Spouse name: Not on file  ? Number of children: Not on file  ? Years of education: Not on file  ? Highest education level: Not on file  ?Occupational History  ? Not on file  ?Tobacco Use  ? Smoking status: Never   ? Smokeless tobacco: Never  ?Vaping Use  ? Vaping Use: Never used  ?Substance and Sexual Activity  ? Alcohol use: Yes  ? Drug use: Not on file  ? Sexual activity: Not on file  ?Other Topics Concern  ? Not on file  ?Social History Narrative  ? Not on file  ? ?Social Determinants of Health  ? ?Financial Resource Strain: Not on file  ?Food Insecurity: Not on file  ?Transportation Needs: Not on file  ?Physical Activity: Not on file  ?Stress: Not on file  ?Social Connections: Not on file  ?Intimate Partner Violence: Not on file  ? ?Allergies  ?Allergen Reactions  ? Codeine   ? Erythromycin   ? Gabapentin Other (See Comments)  ? Hydroquinidine   ? Tobramycin   ? ?  ? ?Medications: ?Outpatient Medications Prior to Visit  ?Medication Sig  ? Calcium Carbonate-Vit D-Min (CALCIUM 1200 PO) Take by mouth.  ? timolol (TIMOPTIC) 0.5 % ophthalmic solution 1 drop 2 (two) times daily.  ? valACYclovir (VALTREX) 500 MG tablet Take 500 mg by mouth daily.  ? VITAMIN D PO Take 5,000 Units by mouth daily.  ? [DISCONTINUED] triamcinolone cream (KENALOG) 0.5 % Apply 1 application topically as needed. (Patient not taking: Reported on 03/06/2022)  ? ?No facility-administered medications prior to visit.  ?  ?Allergies  ?Allergen Reactions  ? Codeine   ? Erythromycin   ?  Gabapentin Other (See Comments)  ? Hydroquinidine   ? Tobramycin   ? ? ?Patient Care Team: ?Pleas Koch, NP as PCP - General (Internal Medicine) ? ?Review of Systems  ?Constitutional:  Negative for chills and fever.  ?HENT:  Negative for congestion.   ?Eyes:  Negative for blurred vision.  ?Respiratory:  Negative for shortness of breath.   ?Cardiovascular:  Negative for chest pain.  ?Skin:  Negative for rash.  ?Neurological:  Negative for dizziness and headaches.  ?Endo/Heme/Allergies:  Negative for environmental allergies.  ?Psychiatric/Behavioral:  Negative for depression. The patient is not nervous/anxious.   ? ? ?  ? ?Objective  ?BP 120/72   Pulse 74   Temp 98.3 ?F  (36.8 ?C) (Oral)   Ht '5\' 5"'$  (1.651 m)   Wt 188 lb (85.3 kg)   SpO2 99%   BMI 31.28 kg/m?  ?BP Readings from Last 3 Encounters:  ?03/06/22 120/72  ?09/04/21 110/74  ?02/04/21 114/82  ? ? ECG today with NSR with rate of 70. No PAC/PVC, acute ST changes. No old ECG to compare. ? ?Physical Exam ?HENT:  ?   Right Ear: Tympanic membrane and ear canal normal.  ?   Left Ear: Tympanic membrane and ear canal normal.  ?   Nose: Nose normal.  ?Eyes:  ?   Conjunctiva/sclera: Conjunctivae normal.  ?   Pupils: Pupils are equal, round, and reactive to light.  ?Neck:  ?   Thyroid: No thyromegaly.  ?Cardiovascular:  ?   Rate and Rhythm: Normal rate and regular rhythm.  ?   Heart sounds: No murmur heard. ?Pulmonary:  ?   Effort: Pulmonary effort is normal.  ?   Breath sounds: Normal breath sounds. No rales.  ?Abdominal:  ?   General: Bowel sounds are normal.  ?   Palpations: Abdomen is soft.  ?   Tenderness: There is no abdominal tenderness.  ?Musculoskeletal:     ?   General: Normal range of motion.  ?   Cervical back: Neck supple.  ?Lymphadenopathy:  ?   Cervical: No cervical adenopathy.  ?Skin: ?   General: Skin is warm and dry.  ?   Findings: No rash.  ?Neurological:  ?   Mental Status: She is alert and oriented to person, place, and time.  ?   Cranial Nerves: No cranial nerve deficit.  ?   Deep Tendon Reflexes: Reflexes are normal and symmetric.  ?Psychiatric:     ?   Mood and Affect: Mood normal.  ? ? ? ? ?Most recent functional status assessment: ? ?  03/06/2022  ?  9:32 AM  ?In your present state of health, do you have any difficulty performing the following activities:  ?Hearing? 0  ?Vision? 0  ?Difficulty concentrating or making decisions? 0  ?Walking or climbing stairs? 0  ?Dressing or bathing? 0  ?Doing errands, shopping? 0  ? ?Most recent fall risk assessment: ? ?  03/06/2022  ?  9:32 AM  ?Fall Risk   ?Falls in the past year? 0  ?Number falls in past yr: 0  ?Injury with Fall? 0  ? ? Most recent depression  screenings: ? ?  03/06/2022  ?  9:32 AM 09/04/2021  ?  7:21 AM  ?PHQ 2/9 Scores  ?PHQ - 2 Score 0 0  ?PHQ- 9 Score 0   ? ?Most recent cognitive screening: ?   ? View : No data to display.  ?  ?  ?  ? ?Most recent Audit-C alcohol  use screening ?   ? View : No data to display.  ?  ?  ?  ? ?A score of 3 or more in women, and 4 or more in men indicates increased risk for alcohol abuse, EXCEPT if all of the points are from question 1  ? ?Vision/Hearing Screen: ?Hearing Screening  ?Method: Audiometry  ? '500Hz'$  '1000Hz'$  '2000Hz'$  '4000Hz'$   ?Right ear '25 25 25 25  '$ ?Left ear '25 25 25 25  '$ ? ?Vision Screening  ? Right eye Left eye Both eyes  ?Without correction '20/30 20/25 20/20 '$  ?With correction     ? ? ?Last CBC ?Lab Results  ?Component Value Date  ? WBC 5.4 12/05/2019  ? HGB 14.2 12/05/2019  ? HCT 42.4 12/05/2019  ? MCV 96.5 12/05/2019  ? RDW 12.5 12/05/2019  ? PLT 252.0 12/05/2019  ? ?Last metabolic panel ?Lab Results  ?Component Value Date  ? GLUCOSE 89 01/02/2021  ? NA 139 01/02/2021  ? K 5.1 01/02/2021  ? CL 103 01/02/2021  ? CO2 28 01/02/2021  ? BUN 14 01/02/2021  ? CREATININE 0.93 01/02/2021  ? CALCIUM 9.5 01/02/2021  ? PROT 6.6 01/02/2021  ? ALBUMIN 4.3 01/02/2021  ? BILITOT 0.5 01/02/2021  ? ALKPHOS 70 01/02/2021  ? AST 15 01/02/2021  ? ALT 14 01/02/2021  ? ?Last lipids ?Lab Results  ?Component Value Date  ? CHOL 209 (H) 01/02/2021  ? HDL 80.90 01/02/2021  ? LDLCALC 105 (H) 01/02/2021  ? TRIG 116.0 01/02/2021  ? CHOLHDL 3 01/02/2021  ? ?Last hemoglobin A1c ?No results found for: HGBA1C ?Last thyroid functions ?No results found for: TSH, T3TOTAL, T4TOTAL, THYROIDAB ?Last vitamin D ?Lab Results  ?Component Value Date  ? VD25OH 90.19 01/02/2021  ? ?Last vitamin B12 and Folate ?No results found for: VITAMINB12, FOLATE ?  ? ?No results found for any visits on 03/06/22. ?  ? ?Assessment & Plan  ? ?Annual wellness visit done today including the all of the following: ?Reviewed patient's Family Medical History ?Reviewed and updated  list of patient's medical providers ?Assessment of cognitive impairment was done ?Assessed patient's functional ability ?Established a written schedule for health screening services ?Health Risk Assessent Completed and Rev

## 2022-03-06 NOTE — Assessment & Plan Note (Signed)
Repeat lipid panel pending. ? ?Encouraged healthy diet regular exercise ?

## 2022-03-06 NOTE — Addendum Note (Signed)
Addended by: Francella Solian on: 03/06/2022 10:06 AM ? ? Modules accepted: Orders ? ?

## 2022-03-06 NOTE — Assessment & Plan Note (Signed)
Prevnar 20 due, provided today.  Other vaccines up-to-date. ?Mammogram due, orders placed. ?Colonoscopy ? ?Discussed the importance of a healthy diet and regular exercise in order for weight loss, and to reduce the risk of further co-morbidity. ? ?Exam today as noted. ?Labs pending. ? ?All recommendations provided at end of visit. ?

## 2022-03-06 NOTE — Assessment & Plan Note (Signed)
Reviewed office notes from pulmonology from May 2023. ?Continue oral appliance.  ?

## 2022-03-06 NOTE — Patient Instructions (Signed)
Stop by the lab prior to leaving today. I will notify you of your results once received.   [facility] your mamm[facility] and bone density scan.   It was a pleasure to see you today!   

## 2022-04-29 ENCOUNTER — Ambulatory Visit
Admission: RE | Admit: 2022-04-29 | Discharge: 2022-04-29 | Disposition: A | Discharge status: Home / Self Care | Payer: Medicare Other | Source: Ambulatory Visit | Attending: Primary Care | Admitting: Primary Care

## 2022-04-29 DIAGNOSIS — E2839 Other primary ovarian failure: Secondary | ICD-10-CM

## 2022-04-29 DIAGNOSIS — Z1231 Encounter for screening mammogram for malignant neoplasm of breast: Secondary | ICD-10-CM | POA: Diagnosis present

## 2022-05-08 ENCOUNTER — Ambulatory Visit (INDEPENDENT_AMBULATORY_CARE_PROVIDER_SITE_OTHER): Payer: Medicare Other | Admitting: Primary Care

## 2022-05-08 ENCOUNTER — Telehealth: Payer: Self-pay | Admitting: Primary Care

## 2022-05-08 ENCOUNTER — Encounter: Payer: Self-pay | Admitting: Primary Care

## 2022-05-08 VITALS — BP 110/62 | HR 87 | Temp 98.6°F | Ht 65.0 in | Wt 188.0 lb

## 2022-05-08 DIAGNOSIS — F411 Generalized anxiety disorder: Secondary | ICD-10-CM | POA: Diagnosis not present

## 2022-05-08 MED ORDER — SERTRALINE HCL 25 MG PO TABS: 25.0000 mg | ORAL_TABLET | Freq: Every day | ORAL | 0 refills | Status: DC

## 2022-05-08 NOTE — Telephone Encounter (Signed)
Please notify patient that I heard back from the therapy office and they do not have any female patients that are in network with her insurance.  Here are a few local options.  She can call any of these to request an appointment.  Have her call us back if she needs a referral.  -[facility]    [phone]  -[facility]    [phone]  -[facility]    [facility]    [phone]

## 2022-05-08 NOTE — Progress Notes (Signed)
Subjective:    Patient ID: Chelsea Long, female    DOB: 18-Jun-1956, 66 y.o.   MRN: [id number]  Anxiety Symptoms include nervous/anxious behavior. Patient reports no chest pain, shortness of breath or suicidal ideas.      Chelsea Long is a very pleasant 66 y.o. female with a history of sleep apnea, hyperlipidemia, tinnitus who presents today to discuss anxiety.  Chronic history of anxiety for years, historically mild. A few weeks ago she developed a gradual sense of panic after she noticed an ache in her back. She then began to think worst case scenario. Over the last several years she's experienced the loss of several family members, including her spouse. She doesn't feel that she's really grieved from her losses.   Historically, symptoms include worrying, thinking worst case scenario, feeling irritable, trouble relaxing. She denies feeling down or depressed. She denies SI/HI.  She has been treated for anxiety in the past, was prescribed lorazepam. She is ready for some form of treatment today.    Review of Systems  Respiratory:  Negative for shortness of breath.   Cardiovascular:  Negative for chest pain.  Psychiatric/Behavioral:  Negative for suicidal ideas. The patient is nervous/anxious.        See HPI         Past Medical History:  Diagnosis Date   Chicken pox    Herpes zoster of eye    Migraines    Tinnitus    Vitamin D deficiency     Social History   Socioeconomic History   Marital status: Widowed    Spouse name: Not on file   Number of children: Not on file   Years of education: Not on file   Highest education level: Not on file  Occupational History   Not on file  Tobacco Use   Smoking status: Never   Smokeless tobacco: Never  Vaping Use   Vaping Use: Never used  Substance and Sexual Activity   Alcohol use: Yes   Drug use: Not on file   Sexual activity: Not on file  Other Topics Concern   Not on file  Social History Narrative   Not on file    Social Determinants of Health   Financial Resource Strain: Not on file  Food Insecurity: Not on file  Transportation Needs: Not on file  Physical Activity: Not on file  Stress: Not on file  Social Connections: Not on file  Intimate Partner Violence: Not on file    Past Surgical History:  Procedure Laterality Date   ABLATION  2010   Uterine   [place], LAPAROSCOPIC  2006    Family History  Problem Relation Age of Onset   Cancer Mother    Dementia Mother    Breast cancer Mother 20   Stroke Father    Hypertension Father    Heart attack Father    Hyperlipidemia Father    Diabetes Father    Cancer Maternal Grandmother    Cancer Maternal Grandfather     Allergies  Allergen Reactions   Codeine    Erythromycin    Gabapentin Other (See Comments)   Hydroquinidine    Tobramycin     Current Outpatient Medications on File Prior to Visit  Medication Sig Dispense Refill   Calcium Carbonate-Vit D-Min (CALCIUM 1200 PO) Take by mouth.     clobetasol cream (TEMOVATE) 6.23 % Apply 1 application. topically 2 (two) times daily as needed. 30 g 0   timolol (TIMOPTIC)  0.5 % ophthalmic solution 1 drop 2 (two) times daily.     valACYclovir (VALTREX) 500 MG tablet Take 500 mg by mouth daily.     VITAMIN D PO Take 5,000 Units by mouth daily.     No current facility-administered medications on file prior to visit.    BP 110/62   Pulse 87   Temp 98.6 F (37 C) (Oral)   Ht '5\' 5"'$  (1.651 m)   Wt 188 lb (85.3 kg)   SpO2 98%   BMI 31.28 kg/m  Objective:   Physical Exam Cardiovascular:     Rate and Rhythm: Normal rate and regular rhythm.  Pulmonary:     Effort: Pulmonary effort is normal.     Breath sounds: Normal breath sounds.  Musculoskeletal:     Cervical back: Neck supple.  Skin:    General: Skin is warm and dry.  Psychiatric:     Comments: Briefly tearful during visit           Assessment & Plan:   Problem List Items Addressed This  Visit       Other   GAD (generalized anxiety disorder) - Primary    Chronic, uncontrolled.  Discussed options for treatment, she opts for both therapy and medication.  Referral placed for therapy. Rx for Zoloft 25 mg sent to pharmacy.  We discussed possible side effects of headache, GI upset, drowsiness, and SI/HI. If thoughts of SI/HI develop, we discussed to present to the emergency immediately. Patient verbalized understanding.   Follow up in 6 weeks for re-evaluation.        Relevant Medications   sertraline (ZOLOFT) 25 MG tablet   Other Relevant Orders   Ambulatory referral to Psychology       Pleas Koch, NP

## 2022-05-08 NOTE — Telephone Encounter (Signed)
Left message to return call to our office.  

## 2022-05-08 NOTE — Patient Instructions (Addendum)
Start sertraline (Zoloft) 25 mg for anxiety. Take 1 tablet by mouth once daily.  You will be contacted regarding your referral to therapy.  Please let us know if you have not been contacted within two weeks.   Please schedule a follow up visit for 6 weeks for follow up of anxiety.  It was a pleasure to see you today!

## 2022-05-08 NOTE — Assessment & Plan Note (Signed)
Chronic, uncontrolled.  Discussed options for treatment, she opts for both therapy and medication.  Referral placed for therapy. Rx for Zoloft 25 mg sent to pharmacy.  We discussed possible side effects of headache, GI upset, drowsiness, and SI/HI. If thoughts of SI/HI develop, we discussed to present to the emergency immediately. Patient verbalized understanding.   Follow up in 6 weeks for re-evaluation.

## 2022-05-11 NOTE — Telephone Encounter (Signed)
Patient notified as instructed by telephone and verbalized understanding. 

## 2022-05-11 NOTE — Telephone Encounter (Signed)
[facility] Night - Client Nonclinical Telephone Record  [facility] Client Chelsea Long [facility] Night - Client Client Site [place] - Night Provider Alma Friendly - NP Contact Type Call Who Is Calling Patient / Member / Family / Caregiver Caller Name Chelsea Long Phone Number [phone] Call Type Message Only Information Provided Reason for Call Returning a Call from the Office Initial [name] stated that she is returning a call from the doctor office. Additional Comment Office hours provided. Disp. Time Disposition Final User 05/08/2022 5:24:02 PM General Information Provided Yes [name] Call Closed By: [name] Transaction Date/Time: 05/08/2022 5:20:34 PM (ET

## 2022-05-11 NOTE — Telephone Encounter (Signed)
Left message on voicemail for patient to call the office back. 

## 2022-06-23 ENCOUNTER — Ambulatory Visit: Payer: No Typology Code available for payment source | Admitting: Primary Care

## 2022-07-08 NOTE — Telephone Encounter (Signed)
Last office note you wanted patient to follow up in 6 weeks. Do you want me to call patient and let her know that she needs to reschedule follow up?

## 2022-07-08 NOTE — Telephone Encounter (Signed)
No need for follow up. I told her during our visit that she could cancel if she was doing better.

## 2022-07-15 ENCOUNTER — Ambulatory Visit: Payer: No Typology Code available for payment source | Admitting: Primary Care

## 2022-08-01 ENCOUNTER — Other Ambulatory Visit: Payer: Self-pay | Admitting: Primary Care

## 2022-08-01 DIAGNOSIS — F411 Generalized anxiety disorder: Secondary | ICD-10-CM

## 2022-10-29 ENCOUNTER — Ambulatory Visit: Payer: Medicare Other

## 2022-10-29 ENCOUNTER — Telehealth: Payer: Self-pay

## 2022-10-29 NOTE — Telephone Encounter (Signed)
Patient advised of Clearence Cheek message

## 2022-10-29 NOTE — Telephone Encounter (Signed)
I recommend high dose flu vaccine.  The higher dose provides better chance for more effective and longer coverage.

## 2022-10-29 NOTE — Telephone Encounter (Signed)
Pt has turned 66 and pt wants to know which flu shot Anda Kraft wants pt to  have; regular or high dose. Pt wants to know if any new or different side effects to high dose and what is benefit to take high dose over reg flu  shot. Pt has [place] scheduled for 11/04/21 at 9:30 for a flu shot and pt request cb after reviewed by Anda Kraft. Sending note to Gentry Fitz NP.

## 2022-11-04 ENCOUNTER — Ambulatory Visit: Payer: Medicare Other

## 2022-11-05 ENCOUNTER — Ambulatory Visit (INDEPENDENT_AMBULATORY_CARE_PROVIDER_SITE_OTHER): Payer: Medicare Other

## 2022-11-05 DIAGNOSIS — Z23 Encounter for immunization: Secondary | ICD-10-CM | POA: Diagnosis not present

## 2023-01-28 ENCOUNTER — Telehealth: Payer: Self-pay | Admitting: Primary Care

## 2023-01-28 NOTE — Telephone Encounter (Signed)
Contacted Chelsea Long to schedule their annual wellness visit. Appointment made for 03/17/2023.  [facility] Dial: [phone]

## 2023-01-28 NOTE — Telephone Encounter (Signed)
Patient would like to switch this over to a phone call so she doesn't have to come in twice in one week. Thank you!

## 2023-02-06 ENCOUNTER — Other Ambulatory Visit: Payer: Self-pay | Admitting: Primary Care

## 2023-02-06 DIAGNOSIS — F411 Generalized anxiety disorder: Secondary | ICD-10-CM

## 2023-03-07 IMAGING — MG MM DIGITAL DIAGNOSTIC UNILAT*L* W/ TOMO W/ CAD
8 series · 9 of 24 positions shown · non-contrast
Comparison: Previous exam(s).

CLINICAL DATA: Patient returns after screening study for evaluation
of possible LEFT breast mass and LEFT breast asymmetry.

EXAM:
DIGITAL DIAGNOSTIC UNILATERAL LEFT MAMMOGRAM WITH TOMOSYNTHESIS AND
CAD
TECHNIQUE: Left digital diagnostic mammography and breast tomosynthesis was
performed. The images were evaluated with computer-aided detection.

[L CC synth-2D (1 of 3)]
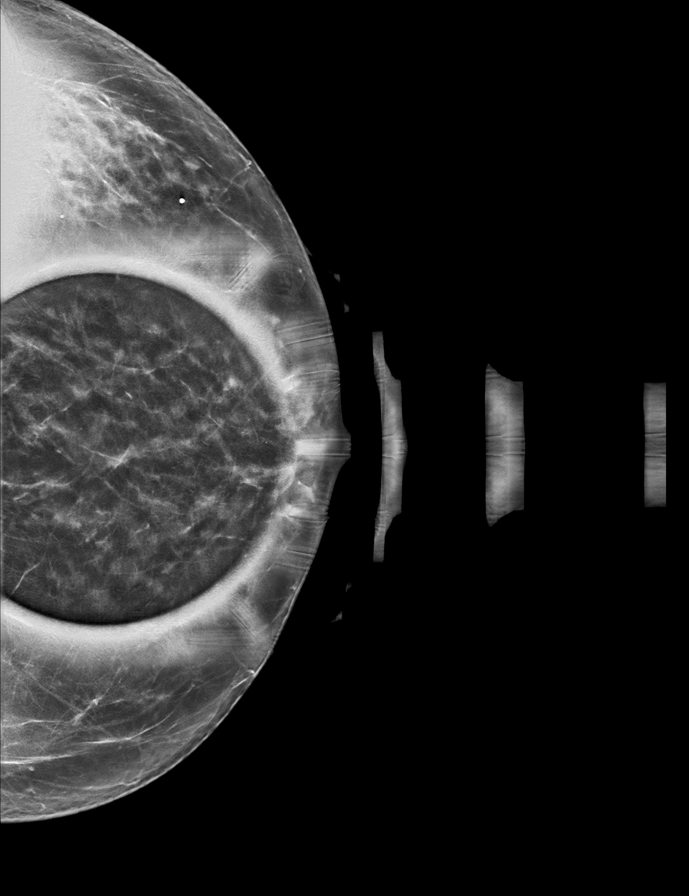

[L CC synth-2D (2 of 3)]
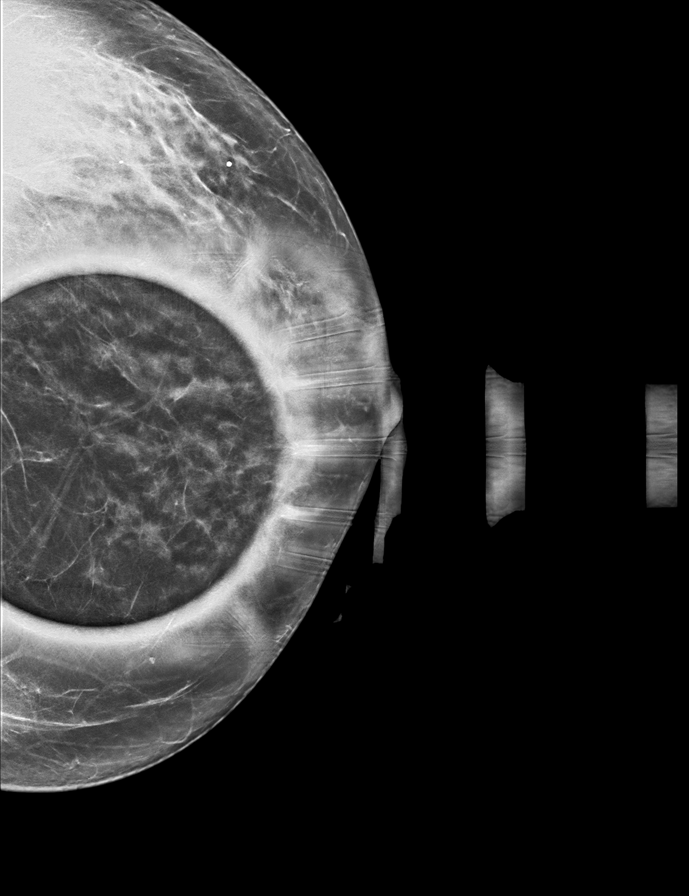

[L ML synth-2D]
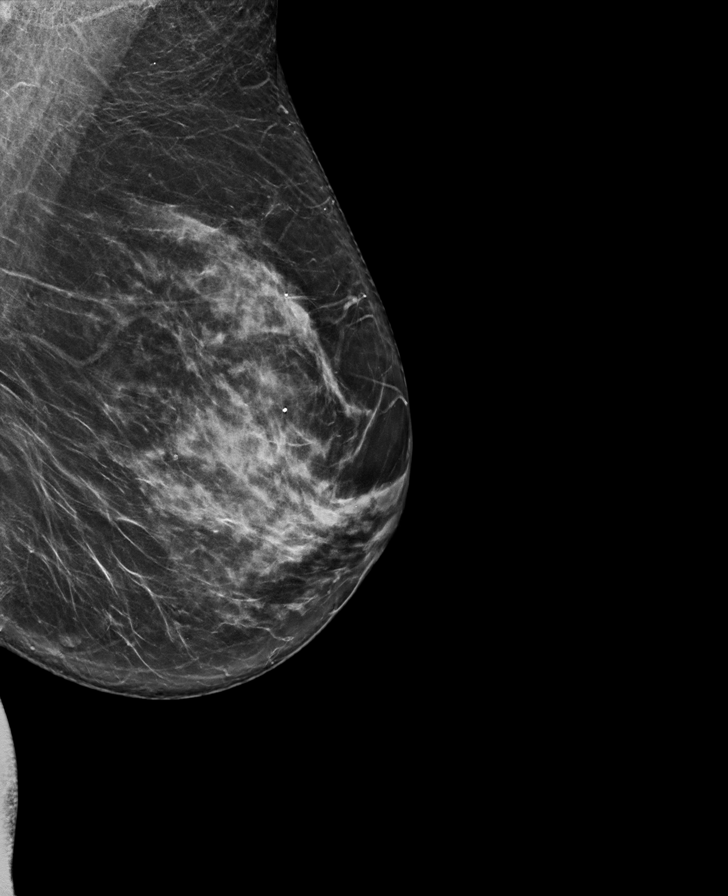

[L CC synth-2D (3 of 3)]
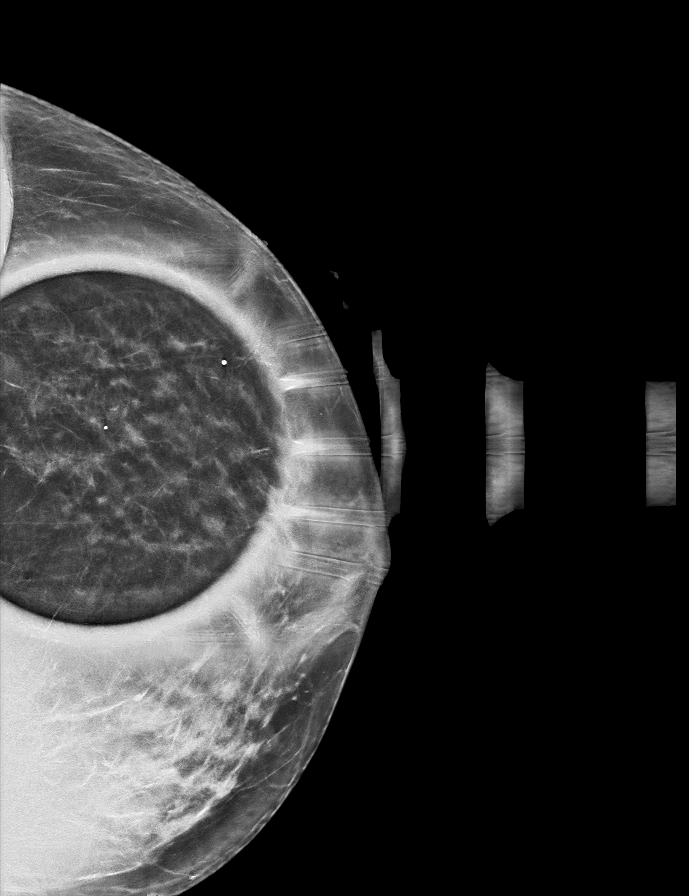

[L CC tomo · 2 of 60 frames shown (1 of 3)]
[frame 20/60]
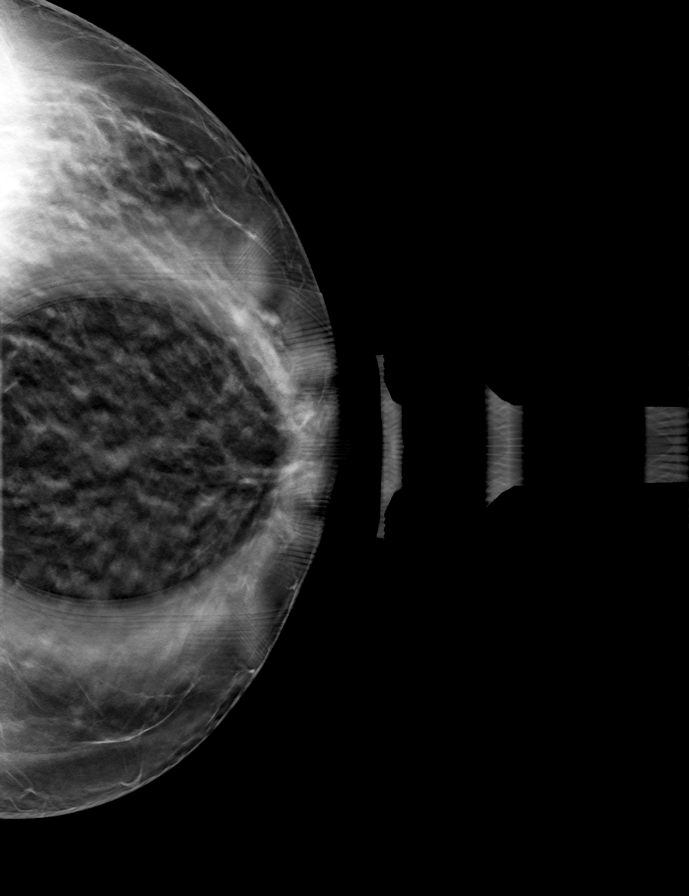
[frame 31/60]
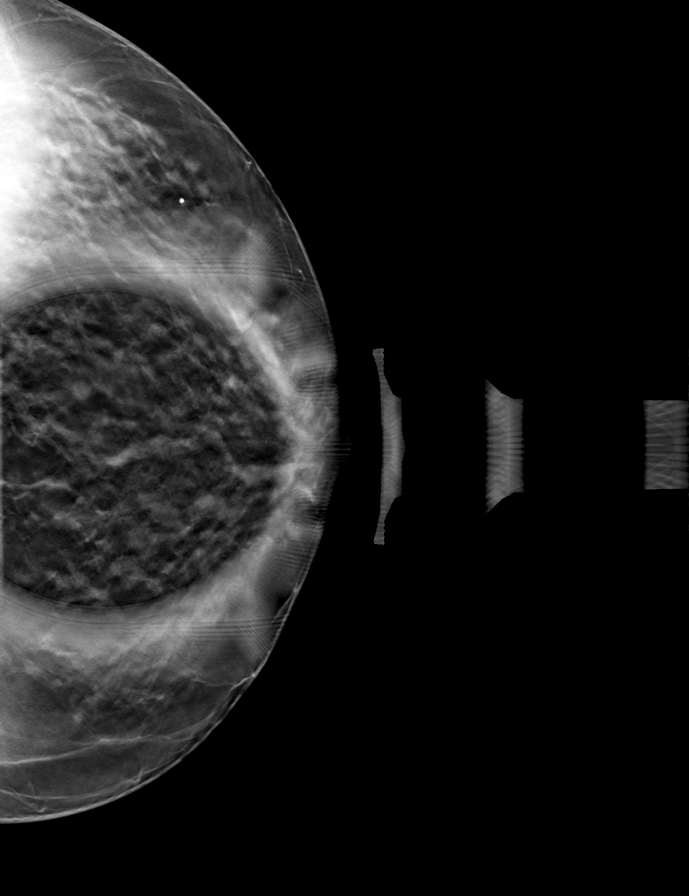

[L CC tomo (2 of 3) · tomo slice 33/64.0]
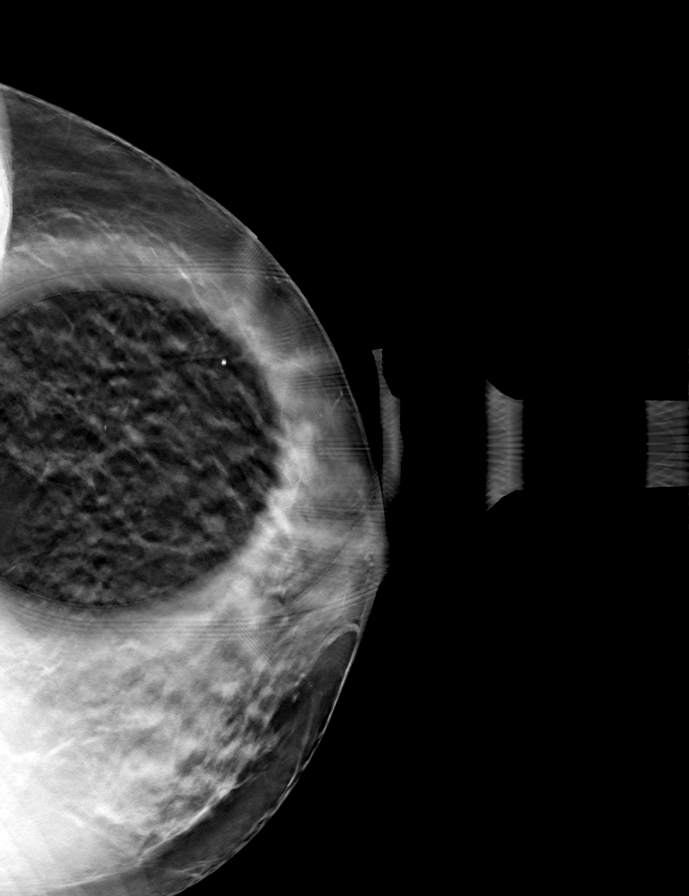

[L ML tomo · tomo slice 40/79.0]
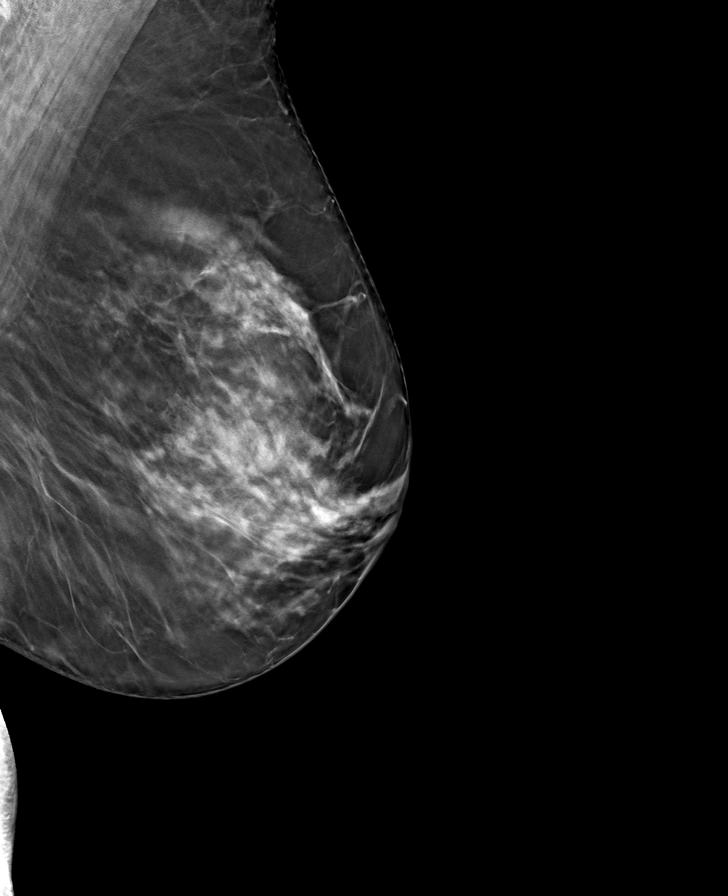

[L CC tomo (3 of 3) · tomo slice 32/63.0]
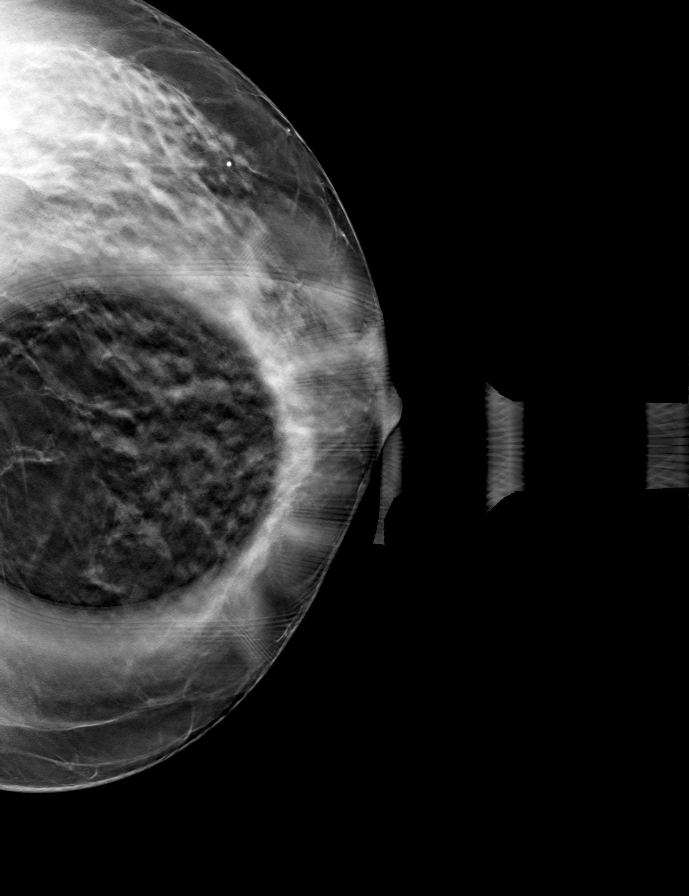

[9 of 24 positions shown; findings below may reference images not displayed]

ACR Breast Density Category c: The breast tissue is heterogeneously
dense, which may obscure small masses.
FINDINGS: Additional 2-D and 3-D images are performed. These views show no
persistent asymmetry or mass in the LEFT breast. No suspicious mass,
distortion, or microcalcifications are identified to suggest
presence of malignancy.
IMPRESSION: No mammographic evidence for malignancy.

RECOMMENDATION:
Screening mammogram in one year.(Code:EX-T-2U0)

I have discussed the findings and recommendations with the patient.
If applicable, a reminder letter will be sent to the patient
regarding the next appointment.

BI-RADS CATEGORY  1: Negative.

## 2023-03-17 ENCOUNTER — Ambulatory Visit (INDEPENDENT_AMBULATORY_CARE_PROVIDER_SITE_OTHER): Payer: Medicare Other

## 2023-03-17 VITALS — Ht 65.0 in | Wt 183.0 lb

## 2023-03-17 DIAGNOSIS — Z1231 Encounter for screening mammogram for malignant neoplasm of breast: Secondary | ICD-10-CM | POA: Diagnosis not present

## 2023-03-17 DIAGNOSIS — Z0000 Encounter for general adult medical examination without abnormal findings: Secondary | ICD-10-CM | POA: Diagnosis not present

## 2023-03-17 NOTE — Progress Notes (Signed)
I connected with  Chelsea Long on 03/17/23 by a  enabled telemedicine application and verified that I am speaaudioking with the correct person using two identifiers.  Patient Location: Home  Provider Location: Home Office  I discussed the limitations of evaluation and management by telemedicine. The patient expressed understanding and agreed to proceed.  Subjective:   Chelsea Long is a 67 y.o. female who presents for Medicare Annual (Subsequent) preventive examination.  Review of Systems      Cardiac Risk Factors include: advanced age (>14men, >72 women);dyslipidemia     Objective:    Today's Vitals   03/17/23 0825  Weight: 183 lb (83 kg)  Height: 5\' 5"  (1.651 m)   Body mass index is 30.45 kg/m.     03/17/2023    8:35 AM  Advanced Directives  Does Patient Have a Medical Advance Directive? Yes  Type of Estate agent of [place];Living will  Copy of Healthcare Power of Attorney in Chart? No - copy requested    Current Medications (verified) Outpatient Encounter Medications as of 03/17/2023  Medication Sig   Calcium Carbonate-Vit D-Min (CALCIUM 1200 PO) Take by mouth.   clobetasol cream (TEMOVATE) 0.05 % Apply 1 application. topically 2 (two) times daily as needed.   Magnesium 200 MG CHEW Chew by mouth.   Multiple Vitamins-Minerals (MULTIVITAMIN ADULT, MINERALS, PO) Take by mouth.   timolol (TIMOPTIC) 0.5 % ophthalmic solution 1 drop 2 (two) times daily.   valACYclovir (VALTREX) 500 MG tablet Take 500 mg by mouth daily.   VITAMIN D PO Take 5,000 Units by mouth daily.   sertraline (ZOLOFT) 25 MG tablet TAKE 1 TABLET (25 MG TOTAL) BY MOUTH DAILY. FOR ANXIETY   No facility-administered encounter medications on file as of 03/17/2023.    Allergies (verified) Codeine, Erythromycin, Gabapentin, Hydroquinidine, and Tobramycin   History: Past Medical History:  Diagnosis Date   Chicken pox    Herpes zoster of eye    Migraines    Tinnitus     Vitamin D deficiency    Past Surgical History:  Procedure Laterality Date   ABLATION  2010   Uterine   APPENDECTOMY  1978   CHOLECYSTECTOMY, LAPAROSCOPIC  2006   Family History  Problem Relation Age of Onset   Cancer Mother    Dementia Mother    Breast cancer Mother 55   Stroke Father    Hypertension Father    Heart attack Father    Hyperlipidemia Father    Diabetes Father    Cancer Maternal Grandmother    Cancer Maternal Grandfather    Social History   Socioeconomic History   Marital status: Widowed    Spouse name: Not on file   Number of children: Not on file   Years of education: Not on file   Highest education level: Not on file  Occupational History   Not on file  Tobacco Use   Smoking status: Never   Smokeless tobacco: Never  Vaping Use   Vaping Use: Never used  Substance and Sexual Activity   Alcohol use: Yes   Drug use: Not on file   Sexual activity: Not on file  Other Topics Concern   Not on file  Social History Narrative   Not on file   Social Determinants of Health   Financial Resource Strain: Low Risk  (03/17/2023)   Overall Financial Resource Strain (CARDIA)    Difficulty of Paying Living Expenses: Not hard at all  Food Insecurity: Patient Unable To Answer (03/17/2023)  Hunger Vital Sign    Worried About Running Out of Food in the Last Year: Patient unable to answer    Ran Out of Food in the Last Year: Patient unable to answer  Transportation Needs: No Transportation Needs (03/17/2023)   PRAPARE - Administrator, Civil Service (Medical): No    Lack of Transportation (Non-Medical): No  Physical Activity: Sufficiently Active (03/17/2023)   Exercise Vital Sign    Days of Exercise per Week: 7 days    Minutes of Exercise per Session: 40 min  Stress: No Stress Concern Present (03/17/2023)   [employer] of Occupational Health - Occupational Stress Questionnaire    Feeling of Stress : Not at all  Social Connections: Moderately  Integrated (03/17/2023)   Social Connection and Isolation Panel [NHANES]    Frequency of Communication with Friends and Family: More than three times a week    Frequency of Social Gatherings with Friends and Family: More than three times a week    Attends Religious Services: More than 4 times per year    Active Member of [employer] or Organizations: Yes    Attends Banker Meetings: More than 4 times per year    Marital Status: Widowed    Tobacco Counseling Counseling given: Not Answered   Clinical Intake:  Pre-visit preparation completed: Yes  Pain : No/denies pain     Nutritional Risks: None Diabetes: No  How often do you need to have someone help you when you read instructions, pamphlets, or other written materials from your doctor or pharmacy?: 1 - Never  Diabetic? no  Interpreter Needed?: No  Information entered by :: C.Haadi Santellan LPN   Activities of Daily Living    03/17/2023    8:37 AM 03/13/2023   10:49 AM  In your present state of health, do you have any difficulty performing the following activities:  Hearing? 0 0  Vision? 0 0  Difficulty concentrating or making decisions? 0 0  Walking or climbing stairs? 0 0  Dressing or bathing? 0 0  Doing errands, shopping? 0 0  Preparing Food and eating ? N N  Using the Toilet? N N  In the past six months, have you accidently leaked urine? Y Y  Comment Occasionally   Do you have problems with loss of bowel control? N N  Managing your Medications? N N  Managing your Finances? N N  Housekeeping or managing your Housekeeping? N N    Patient Care Team: Doreene Nest, NP as PCP - General (Internal Medicine)  Indicate any recent Medical Services you may have received from other than [facility] providers in the past year (date may be approximate).     Assessment:   This is a routine wellness examination for Chelsea Long.  Hearing/Vision screen Hearing Screening - Comments:: No aids Vision Screening - Comments::  Glasses - [facility]/ [employer]  Dietary issues and exercise activities discussed: Current Exercise Habits: Home exercise routine, Type of exercise: walking, Time (Minutes): 40, Frequency (Times/Week): 7, Weekly Exercise (Minutes/Week): 280, Exercise limited by: None identified   Goals Addressed             This Visit's Progress    Patient Stated       Eat better.      Depression Screen    03/17/2023    8:34 AM 05/08/2022    9:32 AM 05/08/2022    9:05 AM 03/06/2022    9:32 AM 09/04/2021    7:21 AM  PHQ  2/9 Scores  PHQ - 2 Score 0 0 0 0 0  PHQ- 9 Score  4 4 0     Fall Risk    03/17/2023    8:36 AM 03/13/2023   10:49 AM 03/06/2022    9:32 AM  Fall Risk   Falls in the past year? 0 0 0  Number falls in past yr: 0 0 0  Injury with Fall? 0 0 0  Risk for fall due to : No Fall Risks    Follow up Falls prevention discussed;Falls evaluation completed      FALL RISK PREVENTION PERTAINING TO THE HOME:  Any stairs in or around the home? Yes  If so, are there any without handrails? No  Home free of loose throw rugs in walkways, pet beds, electrical cords, etc? Yes  Adequate lighting in your home to reduce risk of falls? Yes   ASSISTIVE DEVICES UTILIZED TO PREVENT FALLS:  Life alert? No  Use of a cane, walker or w/c? No  Grab bars in the bathroom? No  Shower chair or bench in shower? Yes  Elevated toilet seat or a handicapped toilet? No    Cognitive Function:        03/17/2023    8:38 AM  6CIT Screen  What Year? 0 points  What month? 0 points  What time? 0 points  Count back from 20 0 points  Months in reverse 0 points  Repeat phrase 0 points  Total Score 0 points    Immunizations Immunization History  Administered Date(s) Administered   Fluad Quad(high Dose 65+) 11/05/2022   Influenza Whole 08/09/2020   Influenza,inj,Quad PF,6+ Mos 08/28/2021   PFIZER(Purple Top)SARS-COV-2 Vaccination 01/15/2020, 02/12/2020, 10/11/2020   PNEUMOCOCCAL CONJUGATE-20  03/06/2022   Tdap 05/30/2009, 01/02/2021   Zoster Recombinat (Shingrix) 03/13/2021, 08/28/2021    TDAP status: Up to date  Flu Vaccine status: Up to date  Pneumococcal vaccine status: Up to date  Covid-19 vaccine status: Information provided on how to obtain vaccines.   Qualifies for Shingles Vaccine? Yes   Zostavax completed  unknown   Shingrix Completed?: Yes  Screening Tests Health Maintenance  Topic Date Due   COVID-19 Vaccine (4 - 2023-24 season) 06/26/2022   INFLUENZA VACCINE  05/27/2023   COLONOSCOPY (Pts 45-26yrs Insurance coverage will need to be confirmed)  11/27/2023   Medicare Annual Wellness (AWV)  03/16/2024   MAMMOGRAM  04/29/2024   DTaP/Tdap/Td (3 - Td or Tdap) 01/03/2031   Pneumonia Vaccine 66+ Years old  Completed   DEXA SCAN  Completed   Hepatitis C Screening  Completed   Zoster Vaccines- Shingrix  Completed   HPV VACCINES  Aged Out    Health Maintenance  Health Maintenance Due  Topic Date Due   COVID-19 Vaccine (4 - 2023-24 season) 06/26/2022    Colorectal cancer screening: Type of screening: Colonoscopy. Completed 11/26/2018. Repeat every 5 years  Mammogram status: Completed 04/29/2022. Repeat every year Order placed.  Bone Density status: Completed 04/29/22. Results reflect: Bone density results: NORMAL. Repeat every 5 years.  Lung Cancer Screening: (Low Dose CT Chest recommended if Age 82-80 years, 30 pack-year currently smoking OR have quit w/in 15years.) does not qualify.   Lung Cancer Screening Referral: no  Additional Screening:  Hepatitis C Screening: does qualify; Completed 01/02/21  Vision Screening: Recommended annual ophthalmology exams for early detection of glaucoma and other disorders of the eye. Is the patient up to date with their annual eye exam?  Yes  Who is the provider  or what is the name of the office in which the patient attends annual eye exams? [facility], [facility] If pt is not established with a  provider, would they like to be referred to a provider to establish care? Yes .   Dental Screening: Recommended annual dental exams for proper oral hygiene  Community Resource Referral / Chronic Care Management: CRR required this visit?  No   CCM required this visit?  No      Plan:     I have personally reviewed and noted the following in the patient's chart:   Medical and social history Use of alcohol, tobacco or illicit drugs  Current medications and supplements including opioid prescriptions. Patient is not currently taking opioid prescriptions. Functional ability and status Nutritional status Physical activity Advanced directives List of other physicians Hospitalizations, surgeries, and ER visits in previous 12 months Vitals Screenings to include cognitive, depression, and falls Referrals and appointments  In addition, I have reviewed and discussed with patient certain preventive protocols, quality metrics, and best practice recommendations. A written personalized care plan for preventive services as well as general preventive health recommendations were provided to patient.     Maryan Puls, LPN   1/61/0960   Nurse Notes: no concerns,. Orders placed for annual mammogram.

## 2023-03-17 NOTE — Progress Notes (Signed)
I connected with  Chelsea Long on 03/17/23 by a  enabled telemedicine application and verified that I am speaaudioking with the correct person using two identifiers.  Patient Location: Home  Provider Location: Home Office  I discussed the limitations of evaluation and management by telemedicine. The patient expressed understanding and agreed to proceed.  Subjective:   Chelsea Long is a 67 y.o. female who presents for Medicare Annual (Subsequent) preventive examination.  Review of Systems      Cardiac Risk Factors include: advanced age (>83men, >78 women);dyslipidemia     Objective:    Today's Vitals   03/17/23 0825  Weight: 183 lb (83 kg)  Height: 5\' 5"  (1.651 m)   Body mass index is 30.45 kg/m.     03/17/2023    8:35 AM  Advanced Directives  Does Patient Have a Medical Advance Directive? Yes  Type of Estate agent of [place];Living will  Copy of Healthcare Power of Attorney in Chart? No - copy requested    Current Medications (verified) Outpatient Encounter Medications as of 03/17/2023  Medication Sig   Calcium Carbonate-Vit D-Min (CALCIUM 1200 PO) Take by mouth.   clobetasol cream (TEMOVATE) 0.05 % Apply 1 application. topically 2 (two) times daily as needed.   Magnesium 200 MG CHEW Chew by mouth.   Multiple Vitamins-Minerals (MULTIVITAMIN ADULT, MINERALS, PO) Take by mouth.   timolol (TIMOPTIC) 0.5 % ophthalmic solution 1 drop 2 (two) times daily.   valACYclovir (VALTREX) 500 MG tablet Take 500 mg by mouth daily.   VITAMIN D PO Take 5,000 Units by mouth daily.   sertraline (ZOLOFT) 25 MG tablet TAKE 1 TABLET (25 MG TOTAL) BY MOUTH DAILY. FOR ANXIETY   No facility-administered encounter medications on file as of 03/17/2023.    Allergies (verified) Codeine, Erythromycin, Gabapentin, Hydroquinidine, and Tobramycin   History: Past Medical History:  Diagnosis Date   Chicken pox    Herpes zoster of eye    Migraines    Tinnitus     Vitamin D deficiency    Past Surgical History:  Procedure Laterality Date   ABLATION  2010   Uterine   APPENDECTOMY  1978   CHOLECYSTECTOMY, LAPAROSCOPIC  2006   Family History  Problem Relation Age of Onset   Cancer Mother    Dementia Mother    Breast cancer Mother 36   Stroke Father    Hypertension Father    Heart attack Father    Hyperlipidemia Father    Diabetes Father    Cancer Maternal Grandmother    Cancer Maternal Grandfather    Social History   Socioeconomic History   Marital status: Widowed    Spouse name: Not on file   Number of children: Not on file   Years of education: Not on file   Highest education level: Not on file  Occupational History   Not on file  Tobacco Use   Smoking status: Never   Smokeless tobacco: Never  Vaping Use   Vaping Use: Never used  Substance and Sexual Activity   Alcohol use: Yes   Drug use: Not on file   Sexual activity: Not on file  Other Topics Concern   Not on file  Social History Narrative   Not on file   Social Determinants of Health   Financial Resource Strain: Low Risk  (03/17/2023)   Overall Financial Resource Strain (CARDIA)    Difficulty of Paying Living Expenses: Not hard at all  Food Insecurity: Patient Unable To Answer (03/17/2023)  Hunger Vital Sign    Worried About Running Out of Food in the Last Year: Patient unable to answer    Ran Out of Food in the Last Year: Patient unable to answer  Transportation Needs: No Transportation Needs (03/17/2023)   PRAPARE - Administrator, Civil Service (Medical): No    Lack of Transportation (Non-Medical): No  Physical Activity: Sufficiently Active (03/17/2023)   Exercise Vital Sign    Days of Exercise per Week: 7 days    Minutes of Exercise per Session: 40 min  Stress: No Stress Concern Present (03/17/2023)   [employer] of Occupational Health - Occupational Stress Questionnaire    Feeling of Stress : Not at all  Social Connections: Moderately  Integrated (03/17/2023)   Social Connection and Isolation Panel [NHANES]    Frequency of Communication with Friends and Family: More than three times a week    Frequency of Social Gatherings with Friends and Family: More than three times a week    Attends Religious Services: More than 4 times per year    Active Member of [employer] or Organizations: Yes    Attends Banker Meetings: More than 4 times per year    Marital Status: Widowed    Tobacco Counseling Counseling given: Not Answered   Clinical Intake:  Pre-visit preparation completed: Yes  Pain : No/denies pain     Nutritional Risks: None Diabetes: No  How often do you need to have someone help you when you read instructions, pamphlets, or other written materials from your doctor or pharmacy?: 1 - Never  Diabetic? no  Interpreter Needed?: No  Information entered by :: C.Knowledge Escandon LPN   Activities of Daily Living    03/17/2023    8:37 AM 03/13/2023   10:49 AM  In your present state of health, do you have any difficulty performing the following activities:  Hearing? 0 0  Vision? 0 0  Difficulty concentrating or making decisions? 0 0  Walking or climbing stairs? 0 0  Dressing or bathing? 0 0  Doing errands, shopping? 0 0  Preparing Food and eating ? N N  Using the Toilet? N N  In the past six months, have you accidently leaked urine? Y Y  Comment Occasionally   Do you have problems with loss of bowel control? N N  Managing your Medications? N N  Managing your Finances? N N  Housekeeping or managing your Housekeeping? N N    Patient Care Team: Doreene Nest, NP as PCP - General (Internal Medicine)  Indicate any recent Medical Services you may have received from other than [facility] providers in the past year (date may be approximate).     Assessment:   This is a routine wellness examination for Chelsea Long.  Hearing/Vision screen Hearing Screening - Comments:: No aids Vision Screening - Comments::  Glasses - [facility]/ [employer]  Dietary issues and exercise activities discussed: Current Exercise Habits: Home exercise routine, Type of exercise: walking, Time (Minutes): 40, Frequency (Times/Week): 7, Weekly Exercise (Minutes/Week): 280, Exercise limited by: None identified   Goals Addressed             This Visit's Progress    Patient Stated       Eat better.       Depression Screen    03/17/2023    8:34 AM 05/08/2022    9:32 AM 05/08/2022    9:05 AM 03/06/2022    9:32 AM 09/04/2021    7:21 AM  PHQ 2/9 Scores  PHQ - 2 Score 0 0 0 0 0  PHQ- 9 Score  4 4 0     Fall Risk    03/17/2023    8:36 AM 03/13/2023   10:49 AM 03/06/2022    9:32 AM  Fall Risk   Falls in the past year? 0 0 0  Number falls in past yr: 0 0 0  Injury with Fall? 0 0 0  Risk for fall due to : No Fall Risks    Follow up Falls prevention discussed;Falls evaluation completed      FALL RISK PREVENTION PERTAINING TO THE HOME:  Any stairs in or around the home? Yes  If so, are there any without handrails? No  Home free of loose throw rugs in walkways, pet beds, electrical cords, etc? Yes  Adequate lighting in your home to reduce risk of falls? Yes   ASSISTIVE DEVICES UTILIZED TO PREVENT FALLS:  Life alert? No  Use of a cane, walker or w/c? No  Grab bars in the bathroom? No  Shower chair or bench in shower? Yes  Elevated toilet seat or a handicapped toilet? No    Cognitive Function:        03/17/2023    8:38 AM  6CIT Screen  What Year? 0 points  What month? 0 points  What time? 0 points  Count back from 20 0 points  Months in reverse 0 points  Repeat phrase 0 points  Total Score 0 points    Immunizations Immunization History  Administered Date(s) Administered   Fluad Quad(high Dose 65+) 11/05/2022   Influenza Whole 08/09/2020   Influenza,inj,Quad PF,6+ Mos 08/28/2021   PFIZER(Purple Top)SARS-COV-2 Vaccination 01/15/2020, 02/12/2020, 10/11/2020   PNEUMOCOCCAL CONJUGATE-20  03/06/2022   Tdap 05/30/2009, 01/02/2021   Zoster Recombinat (Shingrix) 03/13/2021, 08/28/2021    TDAP status: Up to date  Flu Vaccine status: Up to date  Pneumococcal vaccine status: Up to date  Covid-19 vaccine status: Information provided on how to obtain vaccines.   Qualifies for Shingles Vaccine? Yes   Zostavax completed  unknown   Shingrix Completed?: Yes  Screening Tests Health Maintenance  Topic Date Due   COVID-19 Vaccine (4 - 2023-24 season) 06/26/2022   INFLUENZA VACCINE  05/27/2023   COLONOSCOPY (Pts 45-72yrs Insurance coverage will need to be confirmed)  11/27/2023   Medicare Annual Wellness (AWV)  03/16/2024   MAMMOGRAM  04/29/2024   DTaP/Tdap/Td (3 - Td or Tdap) 01/03/2031   Pneumonia Vaccine 93+ Years old  Completed   DEXA SCAN  Completed   Hepatitis C Screening  Completed   Zoster Vaccines- Shingrix  Completed   HPV VACCINES  Aged Out    Health Maintenance  Health Maintenance Due  Topic Date Due   COVID-19 Vaccine (4 - 2023-24 season) 06/26/2022    Colorectal cancer screening: Type of screening: Colonoscopy. Completed 11/26/2018. Repeat every 5 years  Mammogram status: Completed 04/29/2022. Repeat every year Order placed.  Bone Density status: Completed 04/29/22. Results reflect: Bone density results: NORMAL. Repeat every 5 years.  Lung Cancer Screening: (Low Dose CT Chest recommended if Age 14-80 years, 30 pack-year currently smoking OR have quit w/in 15years.) does not qualify.   Lung Cancer Screening Referral: no  Additional Screening:  Hepatitis C Screening: does qualify; Completed 01/02/21  Vision Screening: Recommended annual ophthalmology exams for early detection of glaucoma and other disorders of the eye. Is the patient up to date with their annual eye exam?  Yes  Who is the  provider or what is the name of the office in which the patient attends annual eye exams? [facility], [facility] If pt is not established with a  provider, would they like to be referred to a provider to establish care? Yes .   Dental Screening: Recommended annual dental exams for proper oral hygiene  Community Resource Referral / Chronic Care Management: CRR required this visit?  No   CCM required this visit?  No      Plan:     I have personally reviewed and noted the following in the patient's chart:   Medical and social history Use of alcohol, tobacco or illicit drugs  Current medications and supplements including opioid prescriptions. Patient is not currently taking opioid prescriptions. Functional ability and status Nutritional status Physical activity Advanced directives List of other physicians Hospitalizations, surgeries, and ER visits in previous 12 months Vitals Screenings to include cognitive, depression, and falls Referrals and appointments  In addition, I have reviewed and discussed with patient certain preventive protocols, quality metrics, and best practice recommendations. A written personalized care plan for preventive services as well as general preventive health recommendations were provided to patient.     Maryan Puls, LPN   03/29/5408   Nurse Notes: no concerns

## 2023-03-17 NOTE — Addendum Note (Signed)
Addended by: Maryan Puls on: 03/17/2023 08:50 AM   Modules accepted: Orders

## 2023-03-17 NOTE — Patient Instructions (Signed)
Chelsea Long , Thank you for taking time to come for your Medicare Wellness Visit. I appreciate your ongoing commitment to your health goals. Please review the following plan we discussed and let me know if I can assist you in the future.   These are the goals we discussed:  Goals      Patient Stated     Eat better.        This is a list of the screening recommended for you and due dates:  Health Maintenance  Topic Date Due   COVID-19 Vaccine (4 - 2023-24 season) 06/26/2022   Flu Shot  05/27/2023   Colon Cancer Screening  11/27/2023   Medicare Annual Wellness Visit  03/16/2024   Mammogram  04/29/2024   DTaP/Tdap/Td vaccine (3 - Td or Tdap) 01/03/2031   Pneumonia Vaccine  Completed   DEXA scan (bone density measurement)  Completed   Hepatitis C Screening: USPSTF Recommendation to screen - Ages 62-79 yo.  Completed   Zoster (Shingles) Vaccine  Completed   HPV Vaccine  Aged Out    Advanced directives: Please bring a copy of your health care power of attorney and living will to the office to be added to your chart at your convenience.   Conditions/risks identified: Aim for 30 minutes of exercise or brisk walking, 6-8 glasses of water, and 5 servings of fruits and vegetables each day.   Next appointment: Follow up in one year for your annual wellness visit 03/22/24 @ 8:45 televisit   Preventive Care 65 Years and Older, Female Preventive care refers to lifestyle choices and visits with your health care provider that can promote health and wellness. What does preventive care include? A yearly physical exam. This is also called an annual well check. Dental exams once or twice a year. Routine eye exams. Ask your health care provider how often you should have your eyes checked. Personal lifestyle choices, including: Daily care of your teeth and gums. Regular physical activity. Eating a healthy diet. Avoiding tobacco and drug use. Limiting alcohol use. Practicing safe sex. Taking  low-dose aspirin every day. Taking vitamin and mineral supplements as recommended by your health care provider. What happens during an annual well check? The services and screenings done by your health care provider during your annual well check will depend on your age, overall health, lifestyle risk factors, and family history of disease. Counseling  Your health care provider may ask you questions about your: Alcohol use. Tobacco use. Drug use. Emotional well-being. Home and relationship well-being. Sexual activity. Eating habits. History of falls. Memory and ability to understand (cognition). Work and work Astronomer. Reproductive health. Screening  You may have the following tests or measurements: Height, weight, and BMI. Blood pressure. Lipid and cholesterol levels. These may be checked every 5 years, or more frequently if you are over 49 years old. Skin check. Lung cancer screening. You may have this screening every year starting at age 59 if you have a 30-pack-year history of smoking and currently smoke or have quit within the past 15 years. Fecal occult blood test (FOBT) of the stool. You may have this test every year starting at age 77. Flexible sigmoidoscopy or colonoscopy. You may have a sigmoidoscopy every 5 years or a colonoscopy every 10 years starting at age 83. Hepatitis C blood test. Hepatitis B blood test. Sexually transmitted disease (STD) testing. Diabetes screening. This is done by checking your blood sugar (glucose) after you have not eaten for a while (fasting). You may  have this done every 1-3 years. Bone density scan. This is done to screen for osteoporosis. You may have this done starting at age 81. Mammogram. This may be done every 1-2 years. Talk to your health care provider about how often you should have regular mammograms. Talk with your health care provider about your test results, treatment options, and if necessary, the need for more tests. Vaccines   Your health care provider may recommend certain vaccines, such as: Influenza vaccine. This is recommended every year. Tetanus, diphtheria, and acellular pertussis (Tdap, Td) vaccine. You may need a Td booster every 10 years. Zoster vaccine. You may need this after age 24. Pneumococcal 13-valent conjugate (PCV13) vaccine. One dose is recommended after age 69. Pneumococcal polysaccharide (PPSV23) vaccine. One dose is recommended after age 69. Talk to your health care provider about which screenings and vaccines you need and how often you need them. This information is not intended to replace advice given to you by your health care provider. Make sure you discuss any questions you have with your health care provider. Document Released: 11/08/2015 Document Revised: 07/01/2016 Document Reviewed: 08/13/2015 Elsevier Interactive Patient Education  2017 [employer].  Fall Prevention in the Home Falls can cause injuries. They can happen to people of all ages. There are many things you can do to make your home safe and to help prevent falls. What can I do on the outside of my home? Regularly fix the edges of walkways and driveways and fix any cracks. Remove anything that might make you trip as you walk through a door, such as a raised step or threshold. Trim any bushes or trees on the path to your home. Use bright outdoor lighting. Clear any walking paths of anything that might make someone trip, such as rocks or tools. Regularly check to see if handrails are loose or broken. Make sure that both sides of any steps have handrails. Any raised decks and porches should have guardrails on the edges. Have any leaves, snow, or ice cleared regularly. Use sand or salt on walking paths during winter. Clean up any spills in your garage right away. This includes oil or grease spills. What can I do in the bathroom? Use night lights. Install grab bars by the toilet and in the tub and shower. Do not use towel  bars as grab bars. Use non-skid mats or decals in the tub or shower. If you need to sit down in the shower, use a plastic, non-slip stool. Keep the floor dry. Clean up any water that spills on the floor as soon as it happens. Remove soap buildup in the tub or shower regularly. Attach bath mats securely with double-sided non-slip rug tape. Do not have throw rugs and other things on the floor that can make you trip. What can I do in the bedroom? Use night lights. Make sure that you have a light by your bed that is easy to reach. Do not use any sheets or blankets that are too big for your bed. They should not hang down onto the floor. Have a firm chair that has side arms. You can use this for support while you get dressed. Do not have throw rugs and other things on the floor that can make you trip. What can I do in the kitchen? Clean up any spills right away. Avoid walking on wet floors. Keep items that you use a lot in easy-to-reach places. If you need to reach something above you, use a strong step  stool that has a grab bar. Keep electrical cords out of the way. Do not use floor polish or wax that makes floors slippery. If you must use wax, use non-skid floor wax. Do not have throw rugs and other things on the floor that can make you trip. What can I do with my stairs? Do not leave any items on the stairs. Make sure that there are handrails on both sides of the stairs and use them. Fix handrails that are broken or loose. Make sure that handrails are as long as the stairways. Check any carpeting to make sure that it is firmly attached to the stairs. Fix any carpet that is loose or worn. Avoid having throw rugs at the top or bottom of the stairs. If you do have throw rugs, attach them to the floor with carpet tape. Make sure that you have a light switch at the top of the stairs and the bottom of the stairs. If you do not have them, ask someone to add them for you. What else can I do to help  prevent falls? Wear shoes that: Do not have high heels. Have rubber bottoms. Are comfortable and fit you well. Are closed at the toe. Do not wear sandals. If you use a stepladder: Make sure that it is fully opened. Do not climb a closed stepladder. Make sure that both sides of the stepladder are locked into place. Ask someone to hold it for you, if possible. Clearly mark and make sure that you can see: Any grab bars or handrails. First and last steps. Where the edge of each step is. Use tools that help you move around (mobility aids) if they are needed. These include: Canes. Walkers. Scooters. Crutches. Turn on the lights when you go into a dark area. Replace any light bulbs as soon as they burn out. Set up your furniture so you have a clear path. Avoid moving your furniture around. If any of your floors are uneven, fix them. If there are any pets around you, be aware of where they are. Review your medicines with your doctor. Some medicines can make you feel dizzy. This can increase your chance of falling. Ask your doctor what other things that you can do to help prevent falls. This information is not intended to replace advice given to you by your health care provider. Make sure you discuss any questions you have with your health care provider. Document Released: 08/08/2009 Document Revised: 03/19/2016 Document Reviewed: 11/16/2014 Elsevier Interactive Patient Education  2017 [employer].

## 2023-03-18 ENCOUNTER — Ambulatory Visit (INDEPENDENT_AMBULATORY_CARE_PROVIDER_SITE_OTHER): Payer: Medicare Other | Admitting: Primary Care

## 2023-03-18 ENCOUNTER — Encounter: Payer: Self-pay | Admitting: Primary Care

## 2023-03-18 VITALS — BP 134/76 | HR 85 | Temp 98.5°F | Ht 64.75 in | Wt 183.0 lb

## 2023-03-18 DIAGNOSIS — G4730 Sleep apnea, unspecified: Secondary | ICD-10-CM | POA: Diagnosis not present

## 2023-03-18 DIAGNOSIS — F411 Generalized anxiety disorder: Secondary | ICD-10-CM | POA: Diagnosis not present

## 2023-03-18 DIAGNOSIS — H40052 Ocular hypertension, left eye: Secondary | ICD-10-CM

## 2023-03-18 DIAGNOSIS — N898 Other specified noninflammatory disorders of vagina: Secondary | ICD-10-CM

## 2023-03-18 DIAGNOSIS — E785 Hyperlipidemia, unspecified: Secondary | ICD-10-CM

## 2023-03-18 DIAGNOSIS — Z8619 Personal history of other infectious and parasitic diseases: Secondary | ICD-10-CM

## 2023-03-18 LAB — COMPREHENSIVE METABOLIC PANEL
ALT: 13 U/L (ref 0–35)
AST: 15 U/L (ref 0–37)
Albumin: 4 g/dL (ref 3.5–5.2)
Alkaline Phosphatase: 61 U/L (ref 39–117)
BUN: 15 mg/dL (ref 6–23)
CO2: 30 mEq/L (ref 19–32)
Calcium: 8.8 mg/dL (ref 8.4–10.5)
Chloride: 104 mEq/L (ref 96–112)
Creatinine, Ser: 0.95 mg/dL (ref 0.40–1.20)
GFR: 62.63 mL/min (ref >60.00)
Glucose, Bld: 101 mg/dL — ABNORMAL HIGH (ref 70–99)
Potassium: 4.2 mEq/L (ref 3.5–5.1)
Sodium: 139 mEq/L (ref 135–145)
Total Bilirubin: 0.6 mg/dL (ref 0.2–1.2)
Total Protein: 6.2 g/dL (ref 6.0–8.3)

## 2023-03-18 LAB — LIPID PANEL
Cholesterol: 179 mg/dL (ref 0–200)
HDL: 72.6 mg/dL (ref >39.00)
LDL Cholesterol: 88 mg/dL (ref 0–99)
NonHDL: 106.11
Total CHOL/HDL Ratio: 2
Triglycerides: 91 mg/dL (ref 0.0–149.0)
VLDL: 18.2 mg/dL (ref 0.0–40.0)

## 2023-03-18 LAB — CBC
HCT: 39.9 % (ref 36.0–46.0)
Hemoglobin: 13.2 g/dL (ref 12.0–15.0)
MCHC: 33.1 g/dL (ref 30.0–36.0)
MCV: 95.6 fl (ref 78.0–100.0)
Platelets: 234 10*3/uL (ref 150.0–400.0)
RBC: 4.17 Mil/uL (ref 3.87–5.11)
RDW: 12.6 % (ref 11.5–15.5)
WBC: 3.9 10*3/uL — ABNORMAL LOW (ref 4.0–10.5)

## 2023-03-18 NOTE — Assessment & Plan Note (Signed)
No longer using oral appliance.   Continue to monitor. Following with her dentist.

## 2023-03-18 NOTE — Assessment & Plan Note (Signed)
Following with ophthalmology.  Continue Timolol drops daily

## 2023-03-18 NOTE — Assessment & Plan Note (Signed)
Controlled.  Continue clobetasol cream 0.05% PRN.

## 2023-03-18 NOTE — Assessment & Plan Note (Signed)
Following with opthalmology. Continue Valtrex 500 mg daily.

## 2023-03-18 NOTE — Assessment & Plan Note (Signed)
Repeat lipid panel pending. Add Lipoprotein A given father's history of heart disease and stroke.  Remain off treatment for now.

## 2023-03-18 NOTE — Patient Instructions (Signed)
Stop by the lab prior to leaving today. I will notify you of your results once received.   It was a pleasure to see you today!  

## 2023-03-18 NOTE — Progress Notes (Signed)
Subjective:    Patient ID: Chelsea Long, female    DOB: 10-10-1956, 67 y.o.   MRN: [id number]  HPI  Chelsea Long is a very pleasant 67 y.o. female with a history of sleep apnea, idiopathic peripheral neuropathy, hyperlipidemia, GAD who presents today for follow-up of chronic conditions.  Immunizations: -Tetanus: Completed in 2022 -Shingles: Completed Shingrix series -Pneumonia: Completed Prevnar 20 in 2023  Mammogram: Completed July 2023, scheduled for July 2024 Bone Density Scan: July 2023  Colonoscopy: Completed is due in 2025   1) GAD: Previously managed on sertraline 25 mg daily which was initiated in July 2023 for symptoms of thinking worst-case scenario, grief, frequent worrying.  She contacted [place] in January 2024 requesting instructions for weaning off of Zoloft as symptoms have improved.  Today she's doing well off Zoloft. She's recently moved closer to her daughter and she's gotten a new dog which has helped. She has no concerns today.   2) Sleep Apnea: Previously evaluated by pulmonology in 2023, recommended oral appliance. Today she is no longer wearing an oral appliance due to a clogged saliva duct. She is following with the dentist now.   3) Hyperlipidemia: Not currently on treatment, manages with lifestyle.  Her last lipid panel was completed in May 2023 which revealed LDL of 137, HDL of 69.9.  She has a family history of hypertension, stroke, heart attack, hyperlipidemia in her father.  She denies chest pain, dizziness, headaches.   The 10-year ASCVD risk score (Arnett DK, et al., 2019) is: 6.5%   Values used to calculate the score:     Age: 8 years     Sex: Female     Is Non-Hispanic African American: No     Diabetic: No     Tobacco smoker: No     Systolic Blood Pressure: 134 mmHg     Is BP treated: No     HDL Cholesterol: 69.9 mg/dL     Total Cholesterol: 224 mg/dL   BP Readings from Last 3 Encounters:  03/18/23 134/76  05/08/22 110/62  03/06/22  120/72       Review of Systems  Respiratory:  Negative for shortness of breath.   Cardiovascular:  Negative for chest pain.  Gastrointestinal:  Negative for constipation and diarrhea.  Neurological:  Negative for dizziness and headaches.  Psychiatric/Behavioral:  The patient is not nervous/anxious.          Past Medical History:  Diagnosis Date   Anxiety 2012   Chicken pox    Herpes zoster of eye    Migraines    Preventative health care 01/02/2021   Sleep apnea 2019   Tinnitus    Vitamin D deficiency    Welcome to Medicare preventive visit 03/06/2022    Social History   Socioeconomic History   Marital status: Widowed    Spouse name: Not on file   Number of children: Not on file   Years of education: Not on file   Highest education level: Not on file  Occupational History   Not on file  Tobacco Use   Smoking status: Never   Smokeless tobacco: Never  Vaping Use   Vaping Use: Never used  Substance and Sexual Activity   Alcohol use: Yes   Drug use: Never   Sexual activity: Not Currently    Birth control/protection: Post-menopausal  Other Topics Concern   Not on file  Social History Narrative   Not on file   Social Determinants of Health   Financial Resource  Strain: Low Risk  (03/17/2023)   Overall Financial Resource Strain (CARDIA)    Difficulty of Paying Living Expenses: Not hard at all  Food Insecurity: Patient Unable To Answer (03/17/2023)   Hunger Vital Sign    Worried About Running Out of Food in the Last Year: Patient unable to answer    Ran Out of Food in the Last Year: Patient unable to answer  Transportation Needs: No Transportation Needs (03/17/2023)   PRAPARE - Administrator, Civil Service (Medical): No    Lack of Transportation (Non-Medical): No  Physical Activity: Sufficiently Active (03/17/2023)   Exercise Vital Sign    Days of Exercise per Week: 7 days    Minutes of Exercise per Session: 40 min  Stress: No Stress Concern  Present (03/17/2023)   [employer] of Occupational Health - Occupational Stress Questionnaire    Feeling of Stress : Not at all  Social Connections: Moderately Integrated (03/17/2023)   Social Connection and Isolation Panel [NHANES]    Frequency of Communication with Friends and Family: More than three times a week    Frequency of Social Gatherings with Friends and Family: More than three times a week    Attends Religious Services: More than 4 times per year    Active Member of [employer] or Organizations: Yes    Attends Banker Meetings: More than 4 times per year    Marital Status: Widowed  Intimate Partner Violence: Not At Risk (03/17/2023)   Humiliation, Afraid, Rape, and Kick questionnaire    Fear of Current or Ex-Partner: No    Emotionally Abused: No    Physically Abused: No    Sexually Abused: No    Past Surgical History:  Procedure Laterality Date   ABLATION  2010   Uterine   APPENDECTOMY  1978   CHOLECYSTECTOMY, LAPAROSCOPIC  2006    Family History  Problem Relation Age of Onset   Dementia Mother    Breast cancer Mother 19   Stroke Father 85   Hypertension Father    Heart disease Father    Hyperlipidemia Father    Diabetes Father    Cancer Maternal Grandmother    Cancer Maternal Grandfather     Allergies  Allergen Reactions   Codeine    Erythromycin    Gabapentin Other (See Comments)   Hydroquinidine    Tobramycin     Current Outpatient Medications on File Prior to Visit  Medication Sig Dispense Refill   Calcium Carbonate-Vit D-Min (CALCIUM 1200 PO) Take by mouth.     clobetasol cream (TEMOVATE) 0.05 % Apply 1 application. topically 2 (two) times daily as needed. 30 g 0   Magnesium 200 MG CHEW Chew by mouth.     Multiple Vitamins-Minerals (MULTIVITAMIN ADULT, MINERALS, PO) Take by mouth.     timolol (TIMOPTIC) 0.5 % ophthalmic solution 1 drop 2 (two) times daily.     valACYclovir (VALTREX) 500 MG tablet Take 500 mg by mouth daily.      VITAMIN D PO Take 5,000 Units by mouth daily.     No current facility-administered medications on file prior to visit.    BP 134/76   Pulse 85   Temp 98.5 F (36.9 C) (Temporal)   Ht 5' 4.75" (1.645 m)   Wt 183 lb (83 kg)   SpO2 98%   BMI 30.69 kg/m  Objective:   Physical Exam Cardiovascular:     Rate and Rhythm: Normal rate and regular rhythm.  Pulmonary:  Effort: Pulmonary effort is normal.     Breath sounds: Normal breath sounds.  Abdominal:     Palpations: Abdomen is soft.     Tenderness: There is no abdominal tenderness.  Musculoskeletal:     Cervical back: Neck supple.  Skin:    General: Skin is warm and dry.  Neurological:     Mental Status: She is alert.  Psychiatric:        Mood and Affect: Mood normal.           Assessment & Plan:  Vaginal itching Assessment & Plan: Controlled.  Continue clobetasol cream 0.05% PRN.   Sleep apnea, unspecified type Assessment & Plan: No longer using oral appliance.   Continue to monitor. Following with her dentist.   Hyperlipidemia, unspecified hyperlipidemia type Assessment & Plan: Repeat lipid panel pending. Add Lipoprotein A given father's history of heart disease and stroke.  Remain off treatment for now.  Orders: -     Lipid panel -     Lipoprotein A (LPA) -     Comprehensive metabolic panel -     CBC  GAD (generalized anxiety disorder) Assessment & Plan: Controlled.  Remain off Zoloft 25 mg daily. Continue to monitor.   Increased pressure in the eye, left Assessment & Plan: Following with ophthalmology.  Continue Timolol drops daily   History of herpes zoster of eye Assessment & Plan: Following with opthalmology. Continue Valtrex 500 mg daily.         Doreene Nest, NP

## 2023-03-18 NOTE — Assessment & Plan Note (Signed)
Controlled.  Remain off Zoloft 25 mg daily. Continue to monitor.

## 2023-03-23 LAB — LIPOPROTEIN A (LPA): Lipoprotein (a): <10 nmol/L (ref <75)

## 2023-05-03 ENCOUNTER — Ambulatory Visit
Admission: RE | Admit: 2023-05-03 | Discharge: 2023-05-03 | Disposition: A | Discharge status: Home / Self Care | Payer: Medicare Other | Source: Ambulatory Visit | Attending: Primary Care | Admitting: Primary Care

## 2023-05-03 DIAGNOSIS — Z1231 Encounter for screening mammogram for malignant neoplasm of breast: Secondary | ICD-10-CM | POA: Diagnosis present

## 2023-05-03 DIAGNOSIS — Z0000 Encounter for general adult medical examination without abnormal findings: Secondary | ICD-10-CM

## 2023-10-22 DIAGNOSIS — Z1211 Encounter for screening for malignant neoplasm of colon: Secondary | ICD-10-CM

## 2023-11-11 ENCOUNTER — Encounter: Payer: Self-pay | Admitting: Internal Medicine

## 2023-12-24 ENCOUNTER — Ambulatory Visit (AMBULATORY_SURGERY_CENTER): Payer: Medicare Other

## 2023-12-24 VITALS — Ht 65.0 in | Wt 190.0 lb

## 2023-12-24 DIAGNOSIS — Z1211 Encounter for screening for malignant neoplasm of colon: Secondary | ICD-10-CM

## 2023-12-24 MED ORDER — SUFLAVE 178.7 G PO SOLR: 1.0000 | Freq: Once | ORAL | 0 refills | Status: AC

## 2023-12-24 NOTE — Progress Notes (Signed)
 No egg or soy allergy known to patient  No issues known to pt with past sedation with any surgeries or procedures Patient denies ever being told they had issues or difficulty with intubation  No FH of Malignant Hyperthermia Pt is not on diet pills Pt is not on  home 02  Pt is not on blood thinners  Pt denies issues with constipation  No A fib or A flutter Have any cardiac testing pending--no Pt can ambulate independenlt Pt denies use of chewing tobacco Discussed diabetic I weight loss medication holds Discussed NSAID holds Checked BMI Pt instructed to use Singlecare.com or GoodRx for a price reduction on prep  Patient's chart reviewed by Cathlyn Parsons CNRA prior to previsit and patient appropriate for the [facility].  Pre visit completed and red dot placed by patient's name on their procedure day (on provider's schedule).

## 2024-01-03 ENCOUNTER — Encounter: Payer: Self-pay | Admitting: Certified Registered Nurse Anesthetist

## 2024-01-05 ENCOUNTER — Ambulatory Visit: Payer: Medicare Other | Admitting: Internal Medicine

## 2024-01-05 ENCOUNTER — Encounter: Payer: Self-pay | Admitting: Internal Medicine

## 2024-01-05 VITALS — BP 122/74 | HR 76 | Temp 98.1°F | Resp 19 | Ht 64.75 in | Wt 190.0 lb

## 2024-01-05 DIAGNOSIS — D120 Benign neoplasm of cecum: Secondary | ICD-10-CM | POA: Diagnosis not present

## 2024-01-05 DIAGNOSIS — K635 Polyp of colon: Secondary | ICD-10-CM

## 2024-01-05 DIAGNOSIS — Z860100 Personal history of colon polyps, unspecified: Secondary | ICD-10-CM | POA: Diagnosis not present

## 2024-01-05 DIAGNOSIS — Z1211 Encounter for screening for malignant neoplasm of colon: Secondary | ICD-10-CM

## 2024-01-05 MED ORDER — SODIUM CHLORIDE 0.9 % IV SOLN: 500.0000 mL | Freq: Once | INTRAVENOUS | Status: DC

## 2024-01-05 NOTE — Progress Notes (Signed)
 Called to room to assist during endoscopic procedure.  Patient ID and intended procedure confirmed with present staff. Received instructions for my participation in the procedure from the performing physician.

## 2024-01-05 NOTE — Progress Notes (Signed)
 [facility] History and Physical   Primary Care Physician:  Doreene Nest, NP   Reason for Procedure:    Encounter Diagnosis  Name Primary?   Hx of colonic polyps Yes     Plan:    colonoscopy     HPI: Chelsea Long is a 68 y.o. female here for a surveillance colonoscopy. Two polyps removed 2020 [name] PA and told repeat in 5 years   Past Medical History:  Diagnosis Date   Anxiety 2012   Chicken pox    Herpes zoster of eye    Migraines    Preventative health care 01/02/2021   Sleep apnea 2019   Tinnitus    Vitamin D deficiency    Welcome to Medicare preventive visit 03/06/2022    Past Surgical History:  Procedure Laterality Date   ABLATION  2010   Uterine   APPENDECTOMY  1978   CHOLECYSTECTOMY, LAPAROSCOPIC  2006    Prior to Admission medications   Medication Sig Start Date End Date Taking? Authorizing Provider  Calcium Carbonate-Vit D-Min (CALCIUM 1200 PO) Take by mouth.   Yes [provider]  Magnesium 200 MG CHEW Chew by mouth.   Yes [provider]  Multiple Vitamins-Minerals (MULTIVITAMIN ADULT, MINERALS, PO) Take by mouth.   Yes [provider]  valACYclovir (VALTREX) 500 MG tablet Take 500 mg by mouth daily.   Yes [provider]  VITAMIN D PO Take 5,000 Units by mouth daily.   Yes [provider]  clobetasol cream (TEMOVATE) 0.05 % Apply 1 application. topically 2 (two) times daily as needed. 03/06/22   Doreene Nest, NP    Current Outpatient Medications  Medication Sig Dispense Refill   Calcium Carbonate-Vit D-Min (CALCIUM 1200 PO) Take by mouth.     Magnesium 200 MG CHEW Chew by mouth.     Multiple Vitamins-Minerals (MULTIVITAMIN ADULT, MINERALS, PO) Take by mouth.     valACYclovir (VALTREX) 500 MG tablet Take 500 mg by mouth daily.     VITAMIN D PO Take 5,000 Units by mouth daily.     clobetasol cream (TEMOVATE) 0.05 % Apply 1 application. topically 2 (two) times daily as  needed. 30 g 0   Current Facility-Administered Medications  Medication Dose Route Frequency Provider Last Rate Last Admin   0.9 %  sodium chloride infusion  500 mL Intravenous Once Iva Boop, MD        Allergies as of 01/05/2024 - Review Complete 01/05/2024  Allergen Reaction Noted   Codeine Other (See Comments) 12/05/2019   Erythromycin Other (See Comments) 12/05/2019   Gabapentin Other (See Comments) 01/02/2021   Hydroquinidine Other (See Comments) 12/05/2019   Tobramycin Other (See Comments) 01/02/2021    Family History  Problem Relation Age of Onset   Dementia Mother    Breast cancer Mother 57   Stroke Father 28   Hypertension Father    Heart disease Father    Hyperlipidemia Father    Diabetes Father    Cancer Maternal Grandmother    Cancer Maternal Grandfather    Colon cancer Neg Hx    Colon polyps Neg Hx    Esophageal cancer Neg Hx    Rectal cancer Neg Hx    Stomach cancer Neg Hx     Social History   Socioeconomic History   Marital status: Widowed    Spouse name: Not on file   Number of children: Not on file   Years of education: Not on file   Highest  education level: Not on file  Occupational History   Not on file  Tobacco Use   Smoking status: Never   Smokeless tobacco: Never  Vaping Use   Vaping status: Never Used  Substance and Sexual Activity   Alcohol use: Yes    Comment: socially   Drug use: Never   Sexual activity: Not Currently    Birth control/protection: Post-menopausal  Other Topics Concern   Not on file  Social History Narrative   Not on file   Social Drivers of Health   Financial Resource Strain: Low Risk  (03/17/2023)   Overall Financial Resource Strain (CARDIA)    Difficulty of Paying Living Expenses: Not hard at all  Food Insecurity: Patient Unable To Answer (03/17/2023)   Hunger Vital Sign    Worried About Running Out of Food in the Last Year: Patient unable to answer    Ran Out of Food in the Last Year: Patient unable to  answer  Transportation Needs: No Transportation Needs (03/17/2023)   PRAPARE - Administrator, Civil Service (Medical): No    Lack of Transportation (Non-Medical): No  Physical Activity: Sufficiently Active (03/17/2023)   Exercise Vital Sign    Days of Exercise per Week: 7 days    Minutes of Exercise per Session: 40 min  Stress: No Stress Concern Present (03/17/2023)   [employer] of Occupational Health - Occupational Stress Questionnaire    Feeling of Stress : Not at all  Social Connections: Moderately Integrated (03/17/2023)   Social Connection and Isolation Panel [NHANES]    Frequency of Communication with Friends and Family: More than three times a week    Frequency of Social Gatherings with Friends and Family: More than three times a week    Attends Religious Services: More than 4 times per year    Active Member of [employer] or Organizations: Yes    Attends Banker Meetings: More than 4 times per year    Marital Status: Widowed  Intimate Partner Violence: Not At Risk (03/17/2023)   Humiliation, Afraid, Rape, and Kick questionnaire    Fear of Current or Ex-Partner: No    Emotionally Abused: No    Physically Abused: No    Sexually Abused: No    Review of Systems:  All other review of systems negative except as mentioned in the HPI.  Physical Exam: Vital signs BP (!) 150/87   Pulse 92   Temp 98.1 F (36.7 C) (Skin)   Resp 12   Ht 5' 4.75" (1.645 m)   Wt 190 lb (86.2 kg)   SpO2 100%   BMI 31.86 kg/m   General:   Alert,  Well-developed, well-nourished, pleasant and cooperative in NAD Lungs:  Clear throughout to auscultation.   Heart:  Regular rate and rhythm; no murmurs, clicks, rubs,  or gallops. Abdomen:  Soft, nontender and nondistended. Normal bowel sounds.   Neuro/Psych:  Alert and cooperative. Normal mood and affect. A and O x 3   @Taris Galindo  Sena Slate, MD, [facility] [phone] (pager) 01/05/2024 10:37 AM@

## 2024-01-05 NOTE — Progress Notes (Signed)
 Pt's states no medical or surgical changes since previsit or office visit.

## 2024-01-05 NOTE — Progress Notes (Signed)
 Report given to PACU, vss

## 2024-01-05 NOTE — Op Note (Signed)
 [facility] Patient Name: Chelsea Long Procedure Date: 01/05/2024 10:25 AM MRN: [id number] Endoscopist: Iva Boop , MD, [id number] Age: 68 Referring MD:  Date of Birth: 10/30/1955 Gender: Female Account #: [id number] Procedure:                Colonoscopy Indications:              Surveillance: Personal history of colonic polyps                            (unknown histology) on last colonoscopy 5 years                            ago, Last colonoscopy: 2020 Medicines:                Monitored Anesthesia Care Procedure:                Pre-Anesthesia Assessment:                           - Prior to the procedure, a History and Physical                            was performed, and patient medications and                            allergies were reviewed. The patient's tolerance of                            previous anesthesia was also reviewed. The risks                            and benefits of the procedure and the sedation                            options and risks were discussed with the patient.                            All questions were answered, and informed consent                            was obtained. Prior Anticoagulants: The patient has                            taken no anticoagulant or antiplatelet agents. ASA                            Grade Assessment: II - A patient with mild systemic                            disease. After reviewing the risks and benefits,                            the patient was deemed in satisfactory condition to  undergo the procedure.                           After obtaining informed consent, the colonoscope                            was passed under direct vision. Throughout the                            procedure, the patient's blood pressure, pulse, and                            oxygen saturations were monitored continuously. The                            Olympus Scope [id number] was  introduced through the                            anus and advanced to the the cecum, identified by                            appendiceal orifice and ileocecal valve. The                            colonoscopy was performed without difficulty. The                            patient tolerated the procedure well. The quality                            of the bowel preparation was adequate. The bowel                            preparation used was SUFLAVE via split dose                            instruction. The ileocecal valve, appendiceal                            orifice, and rectum were photographed. Scope In: 10:42:09 AM Scope Out: 10:58:32 AM Scope Withdrawal Time: 0 hours 13 minutes 6 seconds  Total Procedure Duration: 0 hours 16 minutes 23 seconds  Findings:                 The perianal and digital rectal examinations were                            normal.                           Two sessile polyps were found in the cecum. The                            polyps were 1 to 2 mm in size. These polyps were  removed with a cold biopsy forceps. Resection and                            retrieval were complete. Verification of patient                            identification for the specimen was done. Estimated                            blood loss was minimal.                           The exam was otherwise without abnormality on                            direct and retroflexion views. Complications:            No immediate complications. Estimated Blood Loss:     Estimated blood loss was minimal. Impression:               - Two 1 to 2 mm polyps in the cecum, removed with a                            cold biopsy forceps. Resected and retrieved.                           - The examination was otherwise normal on direct                            and retroflexion views.                           - Personal history of colonic polyps. two polyps                             removed 2020 Chelsea Long, Chelsea Long and she was told to                            repeat in 5 years) Recommendation:           - Patient has a contact number available for                            emergencies. The signs and symptoms of potential                            delayed complications were discussed with the                            patient. Return to normal activities tomorrow.                            Written discharge instructions were provided to the  patient.                           - Resume previous diet.                           - Continue present medications.                           - Repeat colonoscopy is recommended. The                            colonoscopy date will be determined after pathology                            results from today's exam become available for                            review. Iva Boop, MD 01/05/2024 11:04:27 AM This report has been signed electronically.

## 2024-01-05 NOTE — Patient Instructions (Addendum)
 Please read handouts provided. Continue present medications. Await pathology results. Resume previous diet.   YOU HAD AN ENDOSCOPIC PROCEDURE TODAY AT THE [facility]:   Refer to the procedure report that was given to you for any specific questions about what was found during the examination.  If the procedure report does not answer your questions, please call your gastroenterologist to clarify.  If you requested that your care partner not be given the details of your procedure findings, then the procedure report has been included in a sealed envelope for you to review at your convenience later.  YOU SHOULD EXPECT: Some feelings of bloating in the abdomen. Passage of more gas than usual.  Walking can help get rid of the air that was put into your GI tract during the procedure and reduce the bloating. If you had a lower endoscopy (such as a colonoscopy or flexible sigmoidoscopy) you may notice spotting of blood in your stool or on the toilet paper. If you underwent a bowel prep for your procedure, you may not have a normal bowel movement for a few days.  Please Note:  You might notice some irritation and congestion in your nose or some drainage.  This is from the oxygen used during your procedure.  There is no need for concern and it should clear up in a day or so.  SYMPTOMS TO REPORT IMMEDIATELY:  Following lower endoscopy (colonoscopy or flexible sigmoidoscopy):  Excessive amounts of blood in the stool  Significant tenderness or worsening of abdominal pains  Swelling of the abdomen that is new, acute  Fever of 100F or higher.  For urgent or emergent issues, a gastroenterologist can be reached at any hour by calling [phone]. Do not use MyChart messaging for urgent concerns.    DIET:  We do recommend a small meal at first, but then you may proceed to your regular diet.  Drink plenty of fluids but you should avoid alcoholic beverages for 24 hours.  ACTIVITY:  You should  plan to take it easy for the rest of today and you should NOT DRIVE or use heavy machinery until tomorrow (because of the sedation medicines used during the test).    FOLLOW UP: Our staff will call the number listed on your records the next business day following your procedure.  We will call around 7:15- 8:00 am to check on you and address any questions or concerns that you may have regarding the information given to you following your procedure. If we do not reach you, we will leave a message.     If any biopsies were taken you will be contacted by phone or by letter within the next 1-3 weeks.  Please call us at [phone] if you have not heard about the biopsies in 3 weeks.    SIGNATURES/CONFIDENTIALITY: You and/or your care partner have signed paperwork which will be entered into your electronic medical record.  These signatures attest to the fact that that the information above on your After Visit Summary has been reviewed and is understood.  Full responsibility of the confidentiality of this discharge information lies with you and/or your care-partner.I found and removed 2 very tiny polyps. All else normal.  I will let you know pathology results and when to have another routine colonoscopy by mail and/or My Chart.  I appreciate the opportunity to care for you. Iva Boop, MD, [facility]

## 2024-01-06 ENCOUNTER — Telehealth: Payer: Self-pay | Admitting: *Deleted

## 2024-01-06 NOTE — Telephone Encounter (Signed)
 Returned pts call.  She has no other symptoms, just nausea.  No vomiting, pain or fever.  She ate dinner last night with no problems but has been nauseous today.  Advised her to stick with light diet today and to call back should nausea worsen or if she develops any additional symptoms.

## 2024-01-06 NOTE — Telephone Encounter (Signed)
  Follow up Call-     01/05/2024    9:33 AM  Call back number  Post procedure Call Back phone  # [phone]  Permission to leave phone message Yes   No answer, left message to call back if needed

## 2024-01-06 NOTE — Telephone Encounter (Signed)
 Patient called and stated that she is doing great and is just feeling a little nauseous, but think that it can be due to her grandchildren that are sick and had spent the night over with her. Patient requested a call back only if we have any recommendation.

## 2024-01-07 LAB — SURGICAL PATHOLOGY

## 2024-01-09 ENCOUNTER — Encounter: Payer: Self-pay | Admitting: Internal Medicine

## 2024-01-11 ENCOUNTER — Telehealth: Payer: Self-pay | Admitting: Internal Medicine

## 2024-01-11 NOTE — Telephone Encounter (Signed)
 Spoke with the pt and discussed why her recall is 7 years.  We talked about the EBP research and that guidelines do support 7 years. She agrees to the 7 years and will call if she has any concerns prior to that time

## 2024-01-11 NOTE — Telephone Encounter (Signed)
 Inbound call to discuss pathology results. She did receive the letter we sent but she's not clear on some things. Please advise.

## 2024-03-06 ENCOUNTER — Telehealth: Payer: Self-pay

## 2024-03-06 NOTE — Telephone Encounter (Signed)
 Absolutely! Very nice patient. We wish her well!

## 2024-03-06 NOTE — Telephone Encounter (Signed)
 Copied from CRM [id number]. Topic: Appointments - Transfer of Care >> Mar 06, 2024 11:44 AM Chelsea Long wrote: Pt is requesting to transfer FROM: [facility] Dr. Fulton Job Pt is requesting to transfer TO: [facility] Dr. Darrold Emms Reason for requested transfer: Pt moved It is the responsibility of the team the patient would like to transfer to (Dr. Darrold Emms) to reach out to the patient if for any reason this transfer is not acceptable.

## 2024-03-21 ENCOUNTER — Ambulatory Visit: Payer: Self-pay

## 2024-03-21 NOTE — Telephone Encounter (Signed)
 Copied from CRM [id number]. Topic: Clinical - Red Word Triage >> Mar 21, 2024  1:25 PM Laquanda P wrote: Red Word that prompted transfer to Nurse Triage: Pain in leg/ bruise - concern about possible blood clot.   Chief Complaint: Leg pain Symptoms: Left calf pain/bruise  Frequency: Constant  Pertinent Negatives: Patient denies shortness of breath or chest pain  Disposition: [] ED /[] Urgent Care (no appt availability in office) / [x] Appointment(In office/virtual)/ []  [facility]/ [] Home Care/ [] Refused Recommended Disposition /[] [facility]/ []  Follow-up with PCP Additional Notes: Patient reports that she noticed a bruise on her left calf with pain that began today. She states her pain is constant and rates it as 2/10. She denies any other symptoms with her pain. Appointment made for tomorrow for evaluation. Patient instructed to call back for new or worsening symptoms. Patient verbalized understanding and agreement with this plan.     Reason for Disposition  [1] Thigh or calf pain AND [2] only 1 side AND [3] present > 1 hour (Exception: Chronic unchanged pain.)    Appointment scheduled for tomorrow morning  Answer Assessment - Initial Assessment Questions 1. ONSET: "When did the pain start?"      Today  2. LOCATION: "Where is the pain located?"      Left calf 3. PAIN: "How bad is the pain?"    (Scale 1-10; or mild, moderate, severe)   -  MILD (1-3): doesn't interfere with normal activities    -  MODERATE (4-7): interferes with normal activities (e.g., work or school) or awakens from sleep, limping    -  SEVERE (8-10): excruciating pain, unable to do any normal activities, unable to walk     2/10 4. WORK OR EXERCISE: "Has there been any recent work or exercise that involved this part of the body?"      No 5. CAUSE: "What do you think is causing the leg pain?"     Concerned about blood clot  6. OTHER SYMPTOMS: "Do you have any other symptoms?" (e.g., chest pain, back  pain, breathing difficulty, swelling, rash, fever, numbness, weakness)     Bruising to left calf  Protocols used: Leg Pain-A-AH

## 2024-03-21 NOTE — Telephone Encounter (Signed)
 Noted and appreciate Bableen's evaluation.

## 2024-03-22 ENCOUNTER — Ambulatory Visit (INDEPENDENT_AMBULATORY_CARE_PROVIDER_SITE_OTHER): Admitting: Family Medicine

## 2024-03-22 ENCOUNTER — Ambulatory Visit: Admitting: General Practice

## 2024-03-22 VITALS — BP 125/74 | HR 102 | Temp 98.1°F | Resp 16 | Ht 64.75 in | Wt 186.4 lb

## 2024-03-22 DIAGNOSIS — R252 Cramp and spasm: Secondary | ICD-10-CM | POA: Diagnosis not present

## 2024-03-22 DIAGNOSIS — M79605 Pain in left leg: Secondary | ICD-10-CM

## 2024-03-22 NOTE — Progress Notes (Signed)
 Subjective:     Patient ID: Chelsea Long, female    DOB: [birth year], 68 y.o.   MRN: [id number]  Chief Complaint  Patient presents with   Calf Pain    Pain and bruising in left calf that started yesterday    HPI Discussed the use of AI scribe software for clinical note transcription with the patient, who gave verbal consent to proceed.  History of Present Illness Chelsea Long "[name]" is a 68 year old female who presents with left leg pain and bruising, concerned about a possible blood clot.  She has been experiencing pain and bruising on her left leg for the past two days, with significant discomfort noted yesterday. No swelling of the leg is present, and she does not recall any specific trauma, although she acknowledges the possibility of bumping into things, especially while caring for her three young grandchildren.  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 There is no known family history of blood clots, although her father had a stroke. She does not take any chronic medications and describes herself as generally healthy.  She leads an active lifestyle, caring for her grandchildren and walking her two dogs, but admits to being 'too sedentary' and not engaging in structured exercise. She plans to travel to the beach soon and has not had any recent surgeries, except for a colonoscopy.  During the review of symptoms, she denies shortness of breath and notes that the leg pain has improved today. She has not been on any long car trips recently but plans to travel on Sunday to the beach.   She experiences pain in her Achilles tendon when descending stairs and has noticed some broken veins on her legs.  For her leg pain, she takes magnesium at night and drinks Gatorade Zero, water, and Propel to stay hydrated. She does not consume soda, only coffee. Occ gets leg cramps    Health Maintenance Due  Topic Date Due    Medicare Annual Wellness (AWV)  05/02/2024    Past Medical History:  Diagnosis Date   Anxiety 2012   Chicken pox    Herpes zoster of eye    Migraines    Preventative health care 01/02/2021   Sleep apnea 2019   Tinnitus    Vitamin D  deficiency    Welcome to Medicare preventive visit 03/06/2022    Past Surgical History:  Procedure Laterality Date   ABLATION  2010   Uterine   APPENDECTOMY  1978   CHOLECYSTECTOMY, LAPAROSCOPIC  2006     Current Outpatient Medications:    Calcium Carbonate-Vit D-Min (CALCIUM 1200 PO), Take by mouth., Disp: , Rfl:    clobetasol  cream (TEMOVATE ) 0.05 %, Apply 1 application. topically 2 (two) times daily as needed., Disp: 30 g, Rfl: 0   Magnesium 200 MG CHEW, Chew by mouth., Disp: , Rfl:    Multiple Vitamins-Minerals (MULTIVITAMIN ADULT, MINERALS, PO), Take by mouth., Disp: , Rfl:    valACYclovir (VALTREX) 500 MG tablet, Take 500 mg by mouth daily., Disp: , Rfl:    VITAMIN D  PO, Take 5,000 Units by mouth daily., Disp: , Rfl:   Allergies  Allergen Reactions   Codeine Other (See Comments)    Dizziness   Erythromycin Other (See Comments)    Dizziness   Gabapentin Other (See Comments)    Dizziness   Hydroquinidine Other (See Comments)    Dizziness   Tobramycin Other (See Comments)  Dizziness   ROS neg/noncontributory except as noted HPI/below      Objective:      BP 125/74   Pulse (!) 102   Temp 98.1 F (36.7 C) (Temporal)   Resp 16   Ht 5' 4.75" (1.645 m)   Wt 186 lb 6 oz (84.5 kg)   SpO2 99%   BMI 31.25 kg/m  Wt Readings from Last 3 Encounters:  03/22/24 186 lb 6 oz (84.5 kg)  01/05/24 190 lb (86.2 kg)  12/24/23 190 lb (86.2 kg)    Physical Exam   Gen: WDWN NAD HEENT: NCAT, conjunctiva not injected, sclera nonicteric CARDIAC: RRR, S1S2+, no murmur.  LUNGS: CTAB. No wheezes EXT:  tr  edema.  Small, nt, poss venous lake L lower leg just inferior to lateral knee.  No calf tenderness, neg Homan's. On medial side,  poss small varicose vein MSK: no gross abnormalities.  NEURO: A&O x3.  CN II-XII intact.  PSYCH: normal mood. Good eye contact     Assessment & Plan:  Leg pain, anterior, left  Leg cramps  Assessment and Plan Assessment & Plan Venous insufficiency   She experiences venous insufficiency with bruising and pain in the left leg, but no swelling. Differential diagnosis includes venous lake, varicose veins, and an unlikely blood clot. Ankle swelling and toe neuropathy are present, with a family history of stroke but not blood clots. Her lifestyle is sedentary, though she remains active with grandchildren and walking dogs. Compression stockings are recommended to prevent blood pooling and varicose veins. Staying active and using compression stockings are crucial to managing symptoms and preventing progression. Wear compression stockings, especially during long car trips. Stop every couple of hours during long trips to walk around. Perform foot flexion and extension exercises while sitting. Use heat, aspirin, ibuprofen, or acetaminophen for pain management.  Achilles tendon pain   She has Achilles tendon pain, especially when descending stairs, with no recent surgeries or injuries. Frequent wearing of flats may contribute to tendon tightness. Stretching exercises are encouraged to alleviate pain and improve flexibility. Hydration and stretching are important to prevent cramping and improve tendon health. Perform stretching exercises, including calf raises and toe walking. Wear supportive footwear to prevent tendon tightness. Increase water intake and consider adding electrolytes to prevent cramping. Use a golf ball or lacrosse ball to roll feet and alleviate Achilles pain.    Return if symptoms worsen or fail to improve.  Ellsworth Haas, MD

## 2024-03-22 NOTE — Patient Instructions (Signed)
 It was very nice to see you today!  Do stretches as we discussed.  Wear compression stockings when traveling and can wear daily as well  Work on increasing exercise.    PLEASE NOTE:  If you had any lab tests please let us  know if you have not heard back within a few days. You may see your results on MyChart before we have a chance to review them but we will give you a call once they are reviewed by us . If we ordered any referrals today, please let us  know if you have not heard from their office within the next week.   Please try these tips to maintain a healthy lifestyle:  Eat most of your calories during the day when you are active. Eliminate processed foods including packaged sweets (pies, cakes, cookies), reduce intake of potatoes, white bread, white pasta, and white rice. Look for whole grain options, oat flour or almond flour.  Each meal should contain half fruits/vegetables, one quarter protein, and one quarter carbs (no bigger than a computer mouse).  Cut down on sweet beverages. This includes juice, soda, and sweet tea. Also watch fruit intake, though this is a healthier sweet option, it still contains natural sugar! Limit to 3 servings daily.  Drink at least 1 glass of water with each meal and aim for at least 8 glasses per day  Exercise at least 150 minutes every week.

## 2024-04-03 ENCOUNTER — Other Ambulatory Visit (HOSPITAL_BASED_OUTPATIENT_CLINIC_OR_DEPARTMENT_OTHER): Payer: Self-pay | Admitting: Primary Care

## 2024-04-03 DIAGNOSIS — Z1231 Encounter for screening mammogram for malignant neoplasm of breast: Secondary | ICD-10-CM

## 2024-04-10 ENCOUNTER — Telehealth: Payer: Self-pay | Admitting: Primary Care

## 2024-04-10 NOTE — Telephone Encounter (Signed)
 Scheduled TOC from Chelsea Long. Please advise.

## 2024-04-10 NOTE — Telephone Encounter (Signed)
 Noted, ok with TOC, thank you.

## 2024-05-08 ENCOUNTER — Encounter (HOSPITAL_BASED_OUTPATIENT_CLINIC_OR_DEPARTMENT_OTHER): Payer: Self-pay | Admitting: Radiology

## 2024-05-08 ENCOUNTER — Ambulatory Visit (HOSPITAL_BASED_OUTPATIENT_CLINIC_OR_DEPARTMENT_OTHER)
Admission: RE | Admit: 2024-05-08 | Discharge: 2024-05-08 | Disposition: A | Discharge status: Home / Self Care | Source: Ambulatory Visit | Attending: Primary Care | Admitting: Primary Care

## 2024-05-08 DIAGNOSIS — Z1231 Encounter for screening mammogram for malignant neoplasm of breast: Secondary | ICD-10-CM | POA: Insufficient documentation

## 2024-05-11 ENCOUNTER — Ambulatory Visit (INDEPENDENT_AMBULATORY_CARE_PROVIDER_SITE_OTHER): Admitting: Physician Assistant

## 2024-05-11 ENCOUNTER — Encounter: Payer: Self-pay | Admitting: Physician Assistant

## 2024-05-11 ENCOUNTER — Ambulatory Visit: Payer: Self-pay | Admitting: Primary Care

## 2024-05-11 VITALS — BP 104/76 | HR 86 | Temp 97.9°F | Ht 64.57 in | Wt 186.4 lb

## 2024-05-11 DIAGNOSIS — Z6831 Body mass index (BMI) 31.0-31.9, adult: Secondary | ICD-10-CM

## 2024-05-11 DIAGNOSIS — E66811 Obesity, class 1: Secondary | ICD-10-CM | POA: Insufficient documentation

## 2024-05-11 DIAGNOSIS — Z818 Family history of other mental and behavioral disorders: Secondary | ICD-10-CM

## 2024-05-11 DIAGNOSIS — N3281 Overactive bladder: Secondary | ICD-10-CM | POA: Diagnosis not present

## 2024-05-11 DIAGNOSIS — R3915 Urgency of urination: Secondary | ICD-10-CM

## 2024-05-11 DIAGNOSIS — Z8619 Personal history of other infectious and parasitic diseases: Secondary | ICD-10-CM | POA: Diagnosis not present

## 2024-05-11 DIAGNOSIS — Z803 Family history of malignant neoplasm of breast: Secondary | ICD-10-CM

## 2024-05-11 DIAGNOSIS — E6609 Other obesity due to excess calories: Secondary | ICD-10-CM

## 2024-05-11 NOTE — Patient Instructions (Addendum)
 [email or web address]   Welcome to [facility] at [facility] [address]! It was a pleasure meeting you today.    PLEASE NOTE:  If you had any LAB tests please let us  know if you have not heard back within a few days. You may see your results on MyChart before we have a chance to review them but we will give you a call once they are reviewed by us . If we ordered any REFERRALS today, please let us  know if you have not heard from their office within the next two weeks. Let us  know through MyChart if you are needing REFILLS, or have your pharmacy send us  the request. You can also use MyChart to communicate with me or any office staff.  Please try these tips to maintain a healthy lifestyle:  Eat most of your calories during the day when you are active. Eliminate processed foods including packaged sweets (pies, cakes, cookies), reduce intake of potatoes, white bread, white pasta, and white rice. Look for whole grain options, oat flour or almond flour.  Each meal should contain half fruits/vegetables, one quarter protein, and one quarter carbs (no bigger than a computer mouse).  Cut down on sweet beverages. This includes juice, soda, and sweet tea. Also watch fruit intake, though this is a healthier sweet option, it still contains natural sugar! Limit to 3 servings daily.  Drink at least 1 glass of water with each meal and aim for at least 8 glasses (64 ounces) per day.  Exercise at least 150 minutes every week to the best of your ability.    Take Care,  Kemarion Abbey, PA-C

## 2024-05-11 NOTE — Progress Notes (Signed)
 [name] ID: Chelsea [name], female    DOB: Oct 04, 1956, 68 y.o.   MRN: [id number]   Assessment & Plan:  Class 1 obesity due to excess calories without serious comorbidity with body mass index (BMI) of 31.0 to 31.9 in adult -     Amb Referral To Provider Referral Exercise Program (P.R.E.P)  Overactive bladder -     Urinalysis, Routine w reflex microscopic -     Ambulatory referral to Urogynecology  Urinary urgency -     Urinalysis, Routine w reflex microscopic -     Ambulatory referral to Urogynecology  History of herpes zoster of eye  Maternal family history of dementia  Family history of breast cancer in mother      Assessment & Plan Urinary Urgency Chelsea [name] experiences urinary urgency with occasional leakage, characterized by a sudden need to urinate and nocturia, especially after consuming decaf beverages at night. There is no dysuria or other urinary symptoms. Suspected overactive bladder, but differential diagnosis includes urinary tract infection and diabetes-related issues. - Perform urinalysis to check for protein, glucose, and other abnormalities. - Consider referral to urogynecology for further evaluation and management. - Discuss potential use of Myrbetriq for overactive bladder if urinalysis is normal.  Family History of Breast Cancer Chelsea [name] has a family history of breast cancer, with her mother having had inflammatory breast cancer. She is proactive about her health and recently underwent a mammogram, with results pending. Regular screenings are crucial given her family history.  Family History of Dementia Chelsea [name]'s mother had dementia, and she is concerned about her cognitive health. Discussed blood tests for genetic markers related to dementia and provided information for further consideration. She is aware of the potential implications of knowing her genetic risk. - Provide information on [facility] for genetic marker testing related to dementia.  General Health  Maintenance Chelsea [name] is in good health but acknowledges the need for improved self-care, particularly in exercise and diet. She has fluctuating weight and is interested in enhancing her physical activity. A provider-recommended exercise program at the [facility] is suggested to help establish a routine. She is due for annual labs and a wellness visit. - Refer to [facility] provider-recommended exercise program (PREP). - Schedule fasting labs and annual wellness visit before the holidays. - Coordinate with health coach Ellouise for annual wellness visit, either in person or over the phone.      Return in about 3 months (around 08/11/2024) for physical, fasting labs .    Subjective:    Chief Complaint  [name] presents with   Transitions Of Care    Pt in the office to est care with PCP; former Chelsea [name]; pt does c/o urinary urgency with urinating in the past year; no other UTI symptoms; declines ever seeing Urology; pt willing to schedule AWV at check out today.     HPI Discussed the use of AI scribe software for clinical note transcription with the [name], who gave verbal consent to proceed.  History of Present Illness Chelsea [name] is a 68 year old female who presents with urinary urgency.  She experiences a sudden and strong need to urinate, which has progressively worsened over the past few months. This urgency often results in leakage before reaching the toilet, leading her to wear protective pads. She denies any burning sensation or other urinary symptoms. She also experiences nocturia, waking up at least once per night to urinate, especially after consuming decaffeinated beverages in the evening.  Her past medical history includes mild  to moderate sleep apnea, for which she previously used an appliance but has since discontinued its use. 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  Socially, she moved from [place]  to [facility] to be closer to her daughter and helps care for her three grandchildren (7, 5, 2). She lives alone, husband passed of stomach cancer in 2019, and struggles with maintaining a consistent exercise and diet routine, noting fluctuations in her weight over the years.     Past Medical History:  Diagnosis Date   Anxiety 2012   Chicken pox    Herpes zoster of eye    Migraines    Preventative health care 01/02/2021   Sleep apnea 2019   Tinnitus    Vitamin D  deficiency    Welcome to Medicare preventive visit 03/06/2022    Past Surgical History:  Procedure Laterality Date   ABLATION  2010   Uterine   APPENDECTOMY  1978   CHOLECYSTECTOMY, LAPAROSCOPIC  2006    Family History  Problem Relation Age of Onset   Dementia Mother    Breast cancer Mother 35   Stroke Father 65   Hypertension Father    Heart disease Father    Hyperlipidemia Father    Diabetes Father    Cancer Maternal Grandmother    Cancer Maternal Grandfather    Colon cancer Neg Hx    Colon polyps Neg Hx    Esophageal cancer Neg Hx    Rectal cancer Neg Hx    Stomach cancer Neg Hx     Social History   Tobacco Use   Smoking status: Never   Smokeless tobacco: Never  Vaping Use   Vaping status: Never Used  Substance Use Topics   Alcohol use: Yes    Comment: socially   Drug use: Never     Allergies  Allergen Reactions   Codeine Other (See Comments)    Dizziness   Erythromycin Other (See Comments)    Dizziness   Gabapentin Other (See Comments)    Dizziness   Hydroquinidine Other (See Comments)    Dizziness   Tobramycin Other (See Comments)    Dizziness    Review of Systems NEGATIVE UNLESS OTHERWISE INDICATED IN HPI      Objective:     BP 104/76 (BP Location: Left Arm, [name] Position: Sitting, Cuff Size: Normal)   Pulse 86   Temp 97.9 F (36.6 C) (Temporal)   Ht 5' 4.57 (1.64 m)   Wt 186 lb 6.4 oz (84.6 kg)   SpO2 97%   BMI  31.44 kg/m   Wt Readings from Last 3 Encounters:  05/11/24 186 lb 6.4 oz (84.6 kg)  03/22/24 186 lb 6 oz (84.5 kg)  01/05/24 190 lb (86.2 kg)    BP Readings from Last 3 Encounters:  05/11/24 104/76  03/22/24 125/74  01/05/24 122/74     Physical Exam Vitals and nursing note reviewed.  Constitutional:      Appearance: Normal appearance. She is obese.  Eyes:     Extraocular Movements: Extraocular movements intact.     Conjunctiva/sclera: Conjunctivae normal.     Pupils: Pupils are equal, round, and reactive to light.  Cardiovascular:     Rate and Rhythm: Normal rate.  Pulmonary:     Effort: Pulmonary effort is normal.  Neurological:     General: No focal deficit present.     Mental Status: She is alert.  Psychiatric:  Mood and Affect: Mood normal.         Time Spent: 30 minutes of total time was spent on the date of the encounter performing the following actions: chart review prior to seeing the [name], obtaining history, performing a medically necessary exam, counseling on the treatment plan, placing orders, and documenting in our EHR.        Elfrieda Espino M Mazzy Santarelli, PA-C

## 2024-05-12 ENCOUNTER — Other Ambulatory Visit: Payer: Self-pay | Admitting: Primary Care

## 2024-05-12 ENCOUNTER — Encounter: Payer: Self-pay | Admitting: Physician Assistant

## 2024-05-12 DIAGNOSIS — R928 Other abnormal and inconclusive findings on diagnostic imaging of breast: Secondary | ICD-10-CM

## 2024-05-12 LAB — URINALYSIS, ROUTINE W REFLEX MICROSCOPIC
Bilirubin Urine: NEGATIVE
Hgb urine dipstick: NEGATIVE
Ketones, ur: NEGATIVE
Leukocytes,Ua: NEGATIVE
Nitrite: NEGATIVE
RBC / HPF: NONE SEEN (ref 0–?)
Specific Gravity, Urine: 1.01 (ref 1.000–1.030)
Total Protein, Urine: NEGATIVE
Urine Glucose: NEGATIVE
Urobilinogen, UA: 0.2 (ref 0.0–1.0)
pH: 7.5 (ref 5.0–8.0)

## 2024-05-12 NOTE — Telephone Encounter (Signed)
 Chelsea Long, FYi regarding her mammogram results. I co-signed the orders for additional imaging so I will forward those to you once received. I hope all is well!

## 2024-05-15 ENCOUNTER — Telehealth: Payer: Self-pay

## 2024-05-15 ENCOUNTER — Ambulatory Visit: Payer: Self-pay | Admitting: Physician Assistant

## 2024-05-15 NOTE — Telephone Encounter (Signed)
 She returned my call, explained PREP she would like to attend Aug 18 class at [facility], every M/W at 12 noon; will contact early August to confirm and set up assessment visit.

## 2024-05-15 NOTE — Telephone Encounter (Signed)
 Called  re: PREP program referral, left voicemail requesting return call.

## 2024-05-22 ENCOUNTER — Ambulatory Visit
Admission: RE | Admit: 2024-05-22 | Discharge: 2024-05-22 | Disposition: A | Discharge status: Home / Self Care | Source: Ambulatory Visit | Attending: Primary Care | Admitting: Primary Care

## 2024-05-22 ENCOUNTER — Ambulatory Visit: Payer: Self-pay | Admitting: Primary Care

## 2024-05-22 ENCOUNTER — Ambulatory Visit
Admission: RE | Admit: 2024-05-22 | Discharge: 2024-05-22 | Disposition: A | Discharge status: Home / Self Care | Source: Ambulatory Visit | Attending: Primary Care

## 2024-05-22 ENCOUNTER — Other Ambulatory Visit: Payer: Self-pay | Admitting: Primary Care

## 2024-05-22 DIAGNOSIS — N6489 Other specified disorders of breast: Secondary | ICD-10-CM

## 2024-05-22 DIAGNOSIS — R928 Other abnormal and inconclusive findings on diagnostic imaging of breast: Secondary | ICD-10-CM

## 2024-06-21 ENCOUNTER — Telehealth: Payer: Self-pay

## 2024-06-21 NOTE — Telephone Encounter (Signed)
 Left [name] a message about PREP Class starting 9/8 and wanted her to come in for her initial assessment next week. Asked her to return my call.

## 2024-06-28 NOTE — Progress Notes (Signed)
 [facility] PREP Evaluation  Patient Details  Name: Chelsea Long MRN: [id number] Date of Birth: [birth year] Age: 68 y.o. PCP: Allwardt, Mardy HERO, PA-C  Vitals:   06/28/24 1325  BP: 119/82  Pulse: 84  SpO2: 97%  Weight: 188 lb 3.2 oz (85.4 kg)     [facility] Eval - 06/28/24 1300       [facility] PREP Location   [facility] PREP Location [facility]      Referral    Referring Provider Allwardt    Reason for referral Obesitity/Overweight    Program Start Date 07/03/24      Measurement   Waist Circumference 41.25 inches    Hip Circumference 43.25 inches    Body fat 31.3 percent      Information for Trainer   Goals --   Learn a healthier diet, overall health, lose weight   Current Exercise None    Orthopedic Concerns None    Current Barriers Grandkids      Mobility and Daily Activities   I find it easy to walk up or down two or more flights of stairs. 2    I have no trouble taking out the trash. 4    I do housework such as vacuuming and dusting on my own without difficulty. 4    I can easily lift a gallon of milk (8lbs). 4    I can easily walk a mile. 3    I have no trouble reaching into high cupboards or reaching down to pick up something from the floor. 3    I do not have trouble doing out-door work such as Loss adjuster, chartered, raking leaves, or gardening. 3      Mobility and Daily Activities   I feel younger than my age. 4    I feel independent. 4    I feel energetic. 2    I live an active life.  2    I feel strong. 2    I feel healthy. 2    I feel active as other people my age. 2      How fit and strong are you.   Fit and Strong Total Score 41         Past Medical History:  Diagnosis Date   Anxiety 2012   Chicken pox    Herpes zoster of eye    Migraines    Preventative health care 01/02/2021   Sleep apnea 2019   Tinnitus    Vitamin D  deficiency    Welcome to Medicare preventive visit 03/06/2022   Past Surgical History:  Procedure Laterality Date   ABLATION   2010   Uterine   APPENDECTOMY  1978   CHOLECYSTECTOMY, LAPAROSCOPIC  2006   Social History   Tobacco Use  Smoking Status Never  Smokeless Tobacco Never  [name] is ready to start the M/W PREP program at [facility] on Sept 8 at 12:00.  [name] 06/28/2024, 1:37 PM

## 2024-07-03 NOTE — Progress Notes (Signed)
 [facility] PREP Weekly Session  Patient Details  Name: Chelsea Long MRN: [id number] Date of Birth: Apr 02, 1956 Age: 68 y.o. PCP: Allwardt, Mardy HERO, PA-C  There were no vitals filed for this visit.   [facility] Weekly seesion - 07/03/24 1400       [facility] PREP Location   [facility] PREP Location [facility]      Weekly Session   Topic Discussed Goal setting and welcome to the program   Went over notebook and strength training log, tour of facility and went over class schedule   Classes attended to date 1          [facility] 07/03/2024, 2:04 PM

## 2024-07-06 ENCOUNTER — Ambulatory Visit (INDEPENDENT_AMBULATORY_CARE_PROVIDER_SITE_OTHER)

## 2024-07-06 VITALS — Ht 65.0 in | Wt 188.0 lb

## 2024-07-06 DIAGNOSIS — Z0000 Encounter for general adult medical examination without abnormal findings: Secondary | ICD-10-CM

## 2024-07-06 NOTE — Progress Notes (Signed)
 Subjective:   Chelsea Long is a 68 y.o. who presents for a Medicare Wellness preventive visit.  As a reminder, Annual Wellness Visits don't include a physical exam, and some assessments may be limited, especially if this visit is performed virtually. We may recommend an in-person follow-up visit with your provider if needed.  Visit Complete: Virtual I connected with  [name] on 07/06/24 by a audio enabled telemedicine application and verified that I am speaking with the correct person using two identifiers.  Patient Location: Home  Provider Location: Office/Clinic  I discussed the limitations of evaluation and management by telemedicine. The patient expressed understanding and agreed to proceed.  Vital Signs: Because this visit was a virtual/telehealth visit, some criteria may be missing or patient reported. Any vitals not documented were not able to be obtained and vitals that have been documented are patient reported.  VideoDeclined- This patient declined Librarian, academic. Therefore the visit was completed with audio only.  Persons Participating in Visit: Patient.  AWV Questionnaire: Yes: Patient Medicare AWV questionnaire was completed by the patient on 07/04/24; I have confirmed that all information answered by patient is correct and no changes since this date.  Cardiac Risk Factors include: advanced age (>59men, >58 women);dyslipidemia;obesity (BMI >30kg/m2)     Objective:    Today's Vitals   07/06/24 0828  Weight: 188 lb (85.3 kg)  Height: 5' 5 (1.651 m)   Body mass index is 31.28 kg/m.     07/06/2024    8:34 AM 03/17/2023    8:35 AM  Advanced Directives  Does Patient Have a Medical Advance Directive? Yes Yes  Type of Estate agent of [place];Living will Healthcare Power of [place];Living will  Copy of Healthcare Power of Attorney in Chart? No - copy requested No - copy requested    Current Medications  (verified) Outpatient Encounter Medications as of 07/06/2024  Medication Sig   Calcium Carbonate-Vit D-Min (CALCIUM 1200 PO) Take by mouth.   Magnesium 200 MG CHEW Chew by mouth.   Multiple Vitamins-Minerals (MULTIVITAMIN ADULT, MINERALS, PO) Take by mouth.   valACYclovir (VALTREX) 500 MG tablet Take 500 mg by mouth daily.   VITAMIN D  PO Take 5,000 Units by mouth daily.   [DISCONTINUED] azithromycin (ZITHROMAX) 250 MG tablet Take 250 mg by mouth daily. (Patient not taking: Reported on 05/11/2024)   [DISCONTINUED] chlorhexidine (PERIDEX) 0.12 % solution  (Patient not taking: Reported on 05/11/2024)   [DISCONTINUED] clobetasol  cream (TEMOVATE ) 0.05 % Apply 1 application. topically 2 (two) times daily as needed.   No facility-administered encounter medications on file as of 07/06/2024.    Allergies (verified) Codeine, Erythromycin, Gabapentin, Hydroquinidine, and Tobramycin   History: Past Medical History:  Diagnosis Date   Anxiety 2012   Chicken pox    Herpes zoster of eye    Migraines    Preventative health care 01/02/2021   Sleep apnea 2019   Tinnitus    Vitamin D  deficiency    Welcome to Medicare preventive visit 03/06/2022   Past Surgical History:  Procedure Laterality Date   ABLATION  2010   Uterine   APPENDECTOMY  1978   CHOLECYSTECTOMY, LAPAROSCOPIC  2006   Family History  Problem Relation Age of Onset   Dementia Mother    Breast cancer Mother 71   Stroke Father 19   Hypertension Father    Heart disease Father    Hyperlipidemia Father    Diabetes Father    Cancer Maternal Grandmother  Cancer Maternal Grandfather    Colon cancer Neg Hx    Colon polyps Neg Hx    Esophageal cancer Neg Hx    Rectal cancer Neg Hx    Stomach cancer Neg Hx    Social History   Socioeconomic History   Marital status: Widowed    Spouse name: Not on file   Number of children: Not on file   Years of education: Not on file   Highest education level: Bachelor's degree (e.g., BA, AB,  BS)  Occupational History   Not on file  Tobacco Use   Smoking status: Never   Smokeless tobacco: Never  Vaping Use   Vaping status: Never Used  Substance and Sexual Activity   Alcohol use: Yes    Comment: socially   Drug use: Never   Sexual activity: Not Currently    Birth control/protection: Post-menopausal  Other Topics Concern   Not on file  Social History Narrative   Not on file   Social Drivers of Health   Financial Resource Strain: Low Risk  (07/06/2024)   Overall Financial Resource Strain (CARDIA)    Difficulty of Paying Living Expenses: Not hard at all  Food Insecurity: No Food Insecurity (07/06/2024)   Hunger Vital Sign    Worried About Running Out of Food in the Last Year: Never true    Ran Out of Food in the Last Year: Never true  Transportation Needs: No Transportation Needs (07/06/2024)   PRAPARE - Administrator, Civil Service (Medical): No    Lack of Transportation (Non-Medical): No  Physical Activity: Insufficiently Active (07/06/2024)   Exercise Vital Sign    Days of Exercise per Week: 2 days    Minutes of Exercise per Session: 60 min  Stress: No Stress Concern Present (07/06/2024)   [employer] of Occupational Health - Occupational Stress Questionnaire    Feeling of Stress: Not at all  Social Connections: Moderately Integrated (07/06/2024)   Social Connection and Isolation Panel    Frequency of Communication with Friends and Family: More than three times a week    Frequency of Social Gatherings with Friends and Family: More than three times a week    Attends Religious Services: More than 4 times per year    Active Member of [employer] or Organizations: Yes    Attends Banker Meetings: 1 to 4 times per year    Marital Status: Widowed    Tobacco Counseling Counseling given: Not Answered    Clinical Intake:  Pre-visit preparation completed: Yes  Pain : No/denies pain     BMI - recorded: 31.28 Nutritional Status: BMI >  30  Obese Nutritional Risks: None Diabetes: No  No results found for: HGBA1C   How often do you need to have someone help you when you read instructions, pamphlets, or other written materials from your doctor or pharmacy?: 1 - Never  Interpreter Needed?: No  Information entered by :: Ellouise Haws, LPN   Activities of Daily Living     07/04/2024    8:53 AM  In your present state of health, do you have any difficulty performing the following activities:  Hearing? 0  Vision? 0  Difficulty concentrating or making decisions? 0  Walking or climbing stairs? 0  Dressing or bathing? 0  Doing errands, shopping? 0  Preparing Food and eating ? N  Using the Toilet? N  In the past six months, have you accidently leaked urine? Y  Comment wears a pad a times  Do you have problems with loss of bowel control? N  Managing your Medications? N  Managing your Finances? N  Housekeeping or managing your Housekeeping? N    Patient Care Team: Allwardt, Alyssa M, PA-C as PCP - General (Physician Assistant)  I have updated your Care Teams any recent Medical Services you may have received from other providers in the past year.     Assessment:   This is a routine wellness examination for Chelsea Long.  Hearing/Vision screen Hearing Screening - Comments:: Pt denies any hearing issues  Vision Screening - Comments:: Wears rx glasses - up to date with routine eye exams with Dr Lucas in     Goals Addressed             This Visit's Progress    Patient Stated       Eat better and exercise        Depression Screen     07/06/2024    8:32 AM 05/11/2024   11:24 AM 03/22/2024   11:10 AM 03/18/2023    7:17 AM 03/17/2023    8:34 AM 05/08/2022    9:32 AM 05/08/2022    9:05 AM  PHQ 2/9 Scores  PHQ - 2 Score 0 0 0 0 0 0 0  PHQ- 9 Score      4 4    Fall Risk     07/04/2024    8:53 AM 05/11/2024   11:24 AM 03/18/2023    7:17 AM 03/17/2023    8:36 AM 03/13/2023   10:49 AM  Fall Risk    Falls in the past year? 0 0 0 0 0  Number falls in past yr: 0 0 0 0 0  Injury with Fall? 0 0 0 0 0  Risk for fall due to : No Fall Risks No Fall Risks No Fall Risks No Fall Risks   Follow up Falls prevention discussed Falls evaluation completed Falls evaluation completed Falls prevention discussed;Falls evaluation completed     MEDICARE RISK AT HOME:  Medicare Risk at Home Any stairs in or around the home?: (Patient-Rptd) Yes If so, are there any without handrails?: (Patient-Rptd) No Home free of loose throw rugs in walkways, pet beds, electrical cords, etc?: (Patient-Rptd) No Adequate lighting in your home to reduce risk of falls?: (Patient-Rptd) Yes Life alert?: (Patient-Rptd) No Use of a cane, walker or w/c?: (Patient-Rptd) No Grab bars in the bathroom?: (Patient-Rptd) No Shower chair or bench in shower?: (Patient-Rptd) Yes Elevated toilet seat or a handicapped toilet?: (Patient-Rptd) No  TIMED UP AND GO:  Was the test performed?  No  Cognitive Function: 6CIT completed        07/06/2024    8:34 AM 03/17/2023    8:38 AM  6CIT Screen  What Year? 0 points 0 points  What month? 0 points 0 points  What time? 0 points 0 points  Count back from 20 0 points 0 points  Months in reverse 0 points 0 points  Repeat phrase 0 points 0 points  Total Score 0 points 0 points    Immunizations Immunization History  Administered Date(s) Administered   Fluad Quad(high Dose 65+) 11/05/2022   Influenza Whole 08/09/2020   Influenza,inj,Quad PF,6+ Mos 08/28/2021   PFIZER(Purple Top)SARS-COV-2 Vaccination 01/15/2020, 02/12/2020, 10/11/2020   PNEUMOCOCCAL CONJUGATE-20 03/06/2022   Tdap 05/30/2009, 01/02/2021   Zoster Recombinant(Shingrix) 03/13/2021, 08/28/2021    Screening Tests Health Maintenance  Topic Date Due   Influenza Vaccine  05/26/2024   Medicare Annual Wellness (AWV)  07/06/2025   Mammogram  05/08/2026   DTaP/Tdap/Td (3 - Td or Tdap) 01/03/2031   Colonoscopy  01/05/2031    Pneumococcal Vaccine: 50+ Years  Completed   DEXA SCAN  Completed   Hepatitis C Screening  Completed   Zoster Vaccines- Shingrix  Completed   HPV VACCINES  Aged Out   Meningococcal B Vaccine  Aged Out   COVID-19 Vaccine  Discontinued    Health Maintenance Items Addressed: See Nurse Notes at the end of this note  Additional Screening:  Vision Screening: Recommended annual ophthalmology exams for early detection of glaucoma and other disorders of the eye. Is the patient up to date with their annual eye exam?  Yes  Who is the provider or what is the name of the office in which the patient attends annual eye exams? Dr Lucas   Dental Screening: Recommended annual dental exams for proper oral hygiene  Community Resource Referral / Chronic Care Management: CRR required this visit?  No   CCM required this visit?  No   Plan:    I have personally reviewed and noted the following in the patient's chart:   Medical and social history Use of alcohol, tobacco or illicit drugs  Current medications and supplements including opioid prescriptions. Patient is not currently taking opioid prescriptions. Functional ability and status Nutritional status Physical activity Advanced directives List of other physicians Hospitalizations, surgeries, and ER visits in previous 12 months Vitals Screenings to include cognitive, depression, and falls Referrals and appointments  In addition, I have reviewed and discussed with patient certain preventive protocols, quality metrics, and best practice recommendations. A written personalized care plan for preventive services as well as general preventive health recommendations were provided to patient.   Ellouise VEAR Haws, LPN   [id number]   After Visit Summary: (MyChart) Due to this being a telephonic visit, the after visit summary with patients personalized plan was offered to patient via MyChart   Notes: Nothing significant to report at this  time. Vaccination discussed will receive at 08/14/24 appt in office

## 2024-07-06 NOTE — Patient Instructions (Signed)
 Chelsea Long,  Thank you for taking the time for your Medicare Wellness Visit. I appreciate your continued commitment to your health goals. Please review the care plan we discussed, and feel free to reach out if I can assist you further.  Medicare recommends these wellness visits once per year to help you and your care team stay ahead of potential health issues. These visits are designed to focus on prevention, allowing your provider to concentrate on managing your acute and chronic conditions during your regular appointments.  Please note that Annual Wellness Visits do not include a physical exam. Some assessments may be limited, especially if the visit was conducted virtually. If needed, we may recommend a separate in-person follow-up with your provider.  Ongoing Care Seeing your primary care provider every 3 to 6 months helps us  monitor your health and provide consistent, personalized care.   Referrals If a referral was made during today's visit and you haven't received any updates within two weeks, please contact the referred provider directly to check on the status.  Recommended Screenings:  Health Maintenance  Topic Date Due   Flu Shot  05/26/2024   Medicare Annual Wellness Visit  07/06/2025   Breast Cancer Screening  05/08/2026   DTaP/Tdap/Td vaccine (3 - Td or Tdap) 01/03/2031   Colon Cancer Screening  01/05/2031   Pneumococcal Vaccine for age over 71  Completed   DEXA scan (bone density measurement)  Completed   Hepatitis C Screening  Completed   Zoster (Shingles) Vaccine  Completed   HPV Vaccine  Aged Out   Meningitis B Vaccine  Aged Out   COVID-19 Vaccine  Discontinued       07/06/2024    8:34 AM  Advanced Directives  Does Patient Have a Medical Advance Directive? Yes  Type of Estate agent of [place];Living will  Copy of Healthcare Power of Attorney in Chart? No - copy requested   Advance Care Planning is important because it: Ensures you  receive medical care that aligns with your values, goals, and preferences. Provides guidance to your family and loved ones, reducing the emotional burden of decision-making during critical moments.  Vision: Annual vision screenings are recommended for early detection of glaucoma, cataracts, and diabetic retinopathy. These exams can also reveal signs of chronic conditions such as diabetes and high blood pressure.  Dental: Annual dental screenings help detect early signs of oral cancer, gum disease, and other conditions linked to overall health, including heart disease and diabetes.  Please see the attached documents for additional preventive care recommendations.

## 2024-07-10 NOTE — Progress Notes (Signed)
 [facility] Weekly Session  Patient Details  Name: Chelsea Long MRN: [id number] Date of Birth: 02-01-1956 Age: 68 y.o. PCP: Kathrene Mardy HERO, PA-C  Vitals:   07/10/24 1447  Weight: 186 lb 9.6 oz (84.6 kg)     [facility] Weekly seesion - 07/10/24 1400       [facility] PRE[facility] Location   [facility] Location [facility]      Weekly Session   Topic Discussed Importance of resistance training;Other ways to be active   Discussed the different between strength training and cardio.   Classes attended to date 3          [name] 07/10/2024, 2:48 PM

## 2024-07-17 NOTE — Progress Notes (Signed)
 [facility] Weekly Session  Patient Details  Name: Chelsea Long MRN: [id number] Date of Birth: July 23, 1956 Age: 68 y.o. PCP: Kathrene Mardy HERO, PA-C  Vitals:   07/17/24 1519  Weight: 187 lb 9.6 oz (85.1 kg)     [facility] Weekly seesion - 07/17/24 1500       [facility] PRE[facility] Location   [facility] PRE[facility] Location [facility]      Weekly Session   Topic Discussed Healthy eating tips   Talk about foods to avoid and increase, sugar and sodium limits. Introduced [employer].   Minutes exercised this week 177 minutes    Classes attended to date 5          [facility] 07/17/2024, 3:22 PM

## 2024-07-24 NOTE — Progress Notes (Signed)
 [facility] Weekly Session  Patient Details  Name: Chelsea Long MRN: [id number] Date of Birth: May 13, 1956 Age: 68 y.o. PCP: Kathrene Mardy HERO, PA-C  Vitals:   07/24/24 1347  Weight: 186 lb 9.6 oz (84.6 kg)     [facility] Weekly seesion - 07/24/24 1300       [facility] PRE[facility] Location   [facility] PRE[facility] Location [facility]      Weekly Session   Topic Discussed Health habits   Talked about ways to make a healthy lifestyle changes a habit, sugar demo   Minutes exercised this week 201 minutes    Classes attended to date 7          [facility] 07/24/2024, 1:49 PM

## 2024-07-31 NOTE — Progress Notes (Signed)
 [facility] Weekly Session  Patient Details  Name: Chelsea Long MRN: [id number] Date of Birth: 11-26-1955 Age: 68 y.o. PCP: Kathrene Mardy HERO, PA-C  Vitals:   07/31/24 1404  Weight: 186 lb (84.4 kg)     [facility] Weekly seesion - 07/31/24 1400       [facility] PREP Location   [facility] PREP Location [facility]      Weekly Session   Topic Discussed Restaurant Eating   Restaurant tips and advice, nutrition label, salt demo   Minutes exercised this week 255 minutes    Classes attended to date [address] 07/31/2024, 2:06 PM

## 2024-08-07 NOTE — Progress Notes (Signed)
 [facility] Weekly Session  Patient Details  Name: Chelsea Long MRN: [id number] Date of Birth: 10/12/1956 Age: 68 y.o. PCP: Kathrene Mardy HERO, PA-C  Vitals:   08/07/24 1517  Weight: 186 lb 6.4 oz (84.6 kg)     [facility] Weekly seesion - 08/07/24 1500       [facility] PREP Location   [facility] PRE[facility] Location [facility]      Weekly Session   Topic Discussed Stress management and problem solving   Stress management, breathing technique, sleep tips   Minutes exercised this week 230 minutes    Classes attended to date [address] 08/07/2024, 3:17 PM

## 2024-08-14 ENCOUNTER — Ambulatory Visit: Payer: Self-pay | Admitting: Physician Assistant

## 2024-08-14 ENCOUNTER — Ambulatory Visit: Admitting: Physician Assistant

## 2024-08-14 VITALS — BP 126/78 | HR 85 | Temp 97.5°F | Ht 65.0 in | Wt 185.6 lb

## 2024-08-14 DIAGNOSIS — G4730 Sleep apnea, unspecified: Secondary | ICD-10-CM

## 2024-08-14 DIAGNOSIS — E785 Hyperlipidemia, unspecified: Secondary | ICD-10-CM | POA: Diagnosis not present

## 2024-08-14 DIAGNOSIS — G4733 Obstructive sleep apnea (adult) (pediatric): Secondary | ICD-10-CM | POA: Diagnosis not present

## 2024-08-14 DIAGNOSIS — D709 Neutropenia, unspecified: Secondary | ICD-10-CM

## 2024-08-14 DIAGNOSIS — E559 Vitamin D deficiency, unspecified: Secondary | ICD-10-CM | POA: Diagnosis not present

## 2024-08-14 DIAGNOSIS — Z01110 Encounter for hearing examination following failed hearing screening: Secondary | ICD-10-CM

## 2024-08-14 DIAGNOSIS — R3915 Urgency of urination: Secondary | ICD-10-CM

## 2024-08-14 DIAGNOSIS — R739 Hyperglycemia, unspecified: Secondary | ICD-10-CM

## 2024-08-14 DIAGNOSIS — Z23 Encounter for immunization: Secondary | ICD-10-CM | POA: Diagnosis not present

## 2024-08-14 DIAGNOSIS — R600 Localized edema: Secondary | ICD-10-CM

## 2024-08-14 DIAGNOSIS — L299 Pruritus, unspecified: Secondary | ICD-10-CM

## 2024-08-14 LAB — LIPID PANEL
Cholesterol: 216 mg/dL — ABNORMAL HIGH (ref 0–200)
HDL: 75.3 mg/dL (ref >39.00)
LDL Cholesterol: 121 mg/dL — ABNORMAL HIGH (ref 0–99)
NonHDL: 140.79
Total CHOL/HDL Ratio: 3
Triglycerides: 100 mg/dL (ref 0.0–149.0)
VLDL: 20 mg/dL (ref 0.0–40.0)

## 2024-08-14 LAB — HEMOGLOBIN A1C: Hgb A1c MFr Bld: 5.9 % (ref 4.6–6.5)

## 2024-08-14 LAB — CBC WITH DIFFERENTIAL/PLATELET
Basophils Absolute: 0 K/uL (ref 0.0–0.1)
Basophils Relative: 0.9 % (ref 0.0–3.0)
Eosinophils Absolute: 0.1 K/uL (ref 0.0–0.7)
Eosinophils Relative: 1.8 % (ref 0.0–5.0)
HCT: 41.8 % (ref 36.0–46.0)
Hemoglobin: 14 g/dL (ref 12.0–15.0)
Lymphocytes Relative: 32 % (ref 12.0–46.0)
Lymphs Abs: 1.2 K/uL (ref 0.7–4.0)
MCHC: 33.5 g/dL (ref 30.0–36.0)
MCV: 94.7 fl (ref 78.0–100.0)
Monocytes Absolute: 0.3 K/uL (ref 0.1–1.0)
Monocytes Relative: 7.7 % (ref 3.0–12.0)
Neutro Abs: 2.2 K/uL (ref 1.4–7.7)
Neutrophils Relative %: 57.6 % (ref 43.0–77.0)
Platelets: 223 K/uL (ref 150.0–400.0)
RBC: 4.41 Mil/uL (ref 3.87–5.11)
RDW: 12.7 % (ref 11.5–15.5)
WBC: 3.8 K/uL — ABNORMAL LOW (ref 4.0–10.5)

## 2024-08-14 LAB — COMPREHENSIVE METABOLIC PANEL WITH GFR
ALT: 13 U/L (ref 0–35)
AST: 18 U/L (ref 0–37)
Albumin: 4.5 g/dL (ref 3.5–5.2)
Alkaline Phosphatase: 65 U/L (ref 39–117)
BUN: 15 mg/dL (ref 6–23)
CO2: 28 meq/L (ref 19–32)
Calcium: 9.3 mg/dL (ref 8.4–10.5)
Chloride: 103 meq/L (ref 96–112)
Creatinine, Ser: 0.78 mg/dL (ref 0.40–1.20)
GFR: 78.57 mL/min (ref >60.00)
Glucose, Bld: 82 mg/dL (ref 70–99)
Potassium: 3.9 meq/L (ref 3.5–5.1)
Sodium: 140 meq/L (ref 135–145)
Total Bilirubin: 0.6 mg/dL (ref 0.2–1.2)
Total Protein: 6.8 g/dL (ref 6.0–8.3)

## 2024-08-14 LAB — VITAMIN D 25 HYDROXY (VIT D DEFICIENCY, FRACTURES): VITD: 100.3 ng/mL — ABNORMAL HIGH (ref 30.00–100.00)

## 2024-08-14 NOTE — Progress Notes (Unsigned)
 Patient ID: Chelsea Long, female    DOB: [birth year], 68 y.o.   MRN: [id number]   Assessment & Plan:  Immunization due -     Flu vaccine HIGH DOSE PF(Fluzone Trivalent)  Hyperlipidemia, unspecified hyperlipidemia type -     Lipid panel  Hyperglycemia -     Comprehensive metabolic panel with GFR -     Hemoglobin A1c  Neutropenia, unspecified type -     CBC with Differential/Platelet  Vitamin D  deficiency -     VITAMIN D  25 Hydroxy (Vit-D Deficiency, Fractures)  Sleep apnea, unspecified type  Itching of ear -     Fluocinolone Acetonide; Place 5 drops in ear(s) daily for 7 days.  Dispense: 20 mL; Refill: 0  Encounter for hearing examination after failed hearing screening -     Ambulatory referral to Audiology  Urinary urgency -     Ambulatory referral to Urogynecology  OSA (obstructive sleep apnea)     Assessment & Plan Bilateral lower extremity edema Intermittent bilateral lower extremity edema, worse on the left side, likely related to dietary factors and lifestyle. - Recommend limiting salt intake - Advise wearing compression socks  Urinary urgency and stress incontinence Experiencing urinary urgency and stress incontinence. 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 Did not pass hearing screen, indicating potential hearing loss. - Refer to audiology for further evaluation  Ear canal dermatitis (eczema) Dry skin and flakiness in the ear canal, likely eczema. - Prescribe steroid lotion for ear canal  Hyperlipidemia Hyperlipidemia. Lipid panel is due for evaluation. - Order lipid panel  Hyperglycemia Slightly elevated blood sugar levels. Monitoring for potential early diabetes. - Order A1c test  Vitamin D  deficiency Vitamin D  deficiency, currently managed with supplementation. - Order vitamin D  level  Obstructive sleep apnea, mild to  moderate Diagnosed with mild to moderate obstructive sleep apnea in 2022. Currently not using any treatment due to discomfort with oral appliance. No significant symptoms reported by family members. - Monitor symptoms and reassess if fatigue worsens      Subjective:    Chief Complaint  Patient presents with   Medical Management of Chronic Issues    Patient in to see pcp for annual     HPI Discussed the use of AI scribe software for clinical note transcription with the patient, who gave verbal consent to proceed.  History of Present Illness Chelsea Long is a 68 year old female presenting for a regular follow-up visit.  She is due for routine blood work including CBC, CMP, A1c, and lipid panel. Her blood pressure was noted to be 144/88, which she found surprising as she typically does not have high readings.  She has a history of high cholesterol and previous lab results indicating slightly elevated blood sugar, noted as hyperglycemia. She is currently taking vitamin D  supplements due to a history of deficiency.  She has a history of urgency and urinary incontinence, with an upcoming appointment with a urogynecologist in ten days. The urgency is particularly challenging due to her active lifestyle.  She is actively engaging in exercise, including strength training and cardio, and has lost five pounds since March. She uses a smart watch to track her activity and finds it motivating.  She experiences dry skin in her ear, which she finds bothersome but not painful. 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  She has a history of mild to moderate sleep apnea diagnosed in 2022 through a home sleep study. She previously used a dental appliance but found it uncomfortable and is not currently using any treatment. She does not have a bed partner to report on her snoring  but was told by a family member that she does not snore.  She reports some swelling in her ankles, which she associates with dietary choices, particularly salty foods. She has been using an app to help monitor her food choices and reduce salt intake.     Past Medical History:  Diagnosis Date   Anxiety 2012   Chicken pox    Herpes zoster of eye    Migraines    Preventative health care 01/02/2021   Sleep apnea 2019   Tinnitus    Vitamin D  deficiency    Welcome to Medicare preventive visit 03/06/2022    Past Surgical History:  Procedure Laterality Date   ABLATION  2010   Uterine   APPENDECTOMY  1978   CHOLECYSTECTOMY, LAPAROSCOPIC  2006    Family History  Problem Relation Age of Onset   Dementia Mother    Breast cancer Mother 67   Stroke Father 89   Hypertension Father    Heart disease Father    Hyperlipidemia Father    Diabetes Father    Cancer Maternal Grandmother    Cancer Maternal Grandfather    Colon cancer Neg Hx    Colon polyps Neg Hx    Esophageal cancer Neg Hx    Rectal cancer Neg Hx    Stomach cancer Neg Hx     Social History   Tobacco Use   Smoking status: Never   Smokeless tobacco: Never  Vaping Use   Vaping status: Never Used  Substance Use Topics   Alcohol use: Yes    Comment: socially   Drug use: Never     Allergies  Allergen Reactions   Codeine Other (See Comments)    Dizziness   Erythromycin Other (See Comments)    Dizziness   Gabapentin Other (See Comments)    Dizziness   Hydroquinidine Other (See Comments)    Dizziness   Tobramycin Other (See Comments)    Dizziness    Review of Systems NEGATIVE UNLESS OTHERWISE INDICATED IN HPI      Objective:     BP 126/78 (BP Location: Left Arm, Patient Position: Sitting, Cuff Size: Normal) Comment (Patient Position): manual reading  Pulse 85   Temp (!) 97.5 F (36.4 C) (Temporal)   Ht 5' 5 (1.651 m)   Wt 185 lb 9.6 oz (84.2 kg)   SpO2 98%   BMI 30.89 kg/m   Wt Readings from  Last 3 Encounters:  08/14/24 184 lb 12.8 oz (83.8 kg)  08/14/24 185 lb 9.6 oz (84.2 kg)  08/07/24 186 lb 6.4 oz (84.6 kg)    BP Readings from Last 3 Encounters:  08/14/24 126/78  06/28/24 119/82  05/11/24 104/76     Physical Exam Vitals and nursing note reviewed.  Constitutional:      Appearance: Normal appearance. She is normal weight. She is not toxic-appearing.  HENT:     Head: Normocephalic and atraumatic.     Right Ear: Tympanic membrane, ear canal and external ear normal.     Left Ear: Tympanic membrane, ear canal and external ear normal.     Ears:     Comments: Some flaking noted in left ear  canal    Nose: Nose normal.     Mouth/Throat:     Mouth: Mucous membranes are moist.  Eyes:     Extraocular Movements: Extraocular movements intact.     Conjunctiva/sclera: Conjunctivae normal.     Pupils: Pupils are equal, round, and reactive to light.  Cardiovascular:     Rate and Rhythm: Normal rate and regular rhythm.     Pulses: Normal pulses.     Heart sounds: Normal heart sounds.  Pulmonary:     Effort: Pulmonary effort is normal.     Breath sounds: Normal breath sounds.  Abdominal:     General: Abdomen is flat. Bowel sounds are normal.     Palpations: Abdomen is soft.  Musculoskeletal:        General: Normal range of motion.     Cervical back: Normal range of motion and neck supple.  Skin:    General: Skin is warm and dry.  Neurological:     General: No focal deficit present.     Mental Status: She is alert and oriented to person, place, and time.  Psychiatric:        Mood and Affect: Mood normal.        Behavior: Behavior normal.        Thought Content: Thought content normal.        Judgment: Judgment normal.             Darletta Noblett M Brittannie Tawney, PA-C

## 2024-08-14 NOTE — Progress Notes (Signed)
 [facility] Weekly Session  Patient Details  Name: Chelsea Long MRN: [id number] Date of Birth: 1956-02-24 Age: 68 y.o. PCP: Kathrene Mardy HERO, PA-C  Vitals:   08/14/24 1439  Weight: 184 lb 12.8 oz (83.8 kg)     [facility] Weekly seesion - 08/14/24 1400       [facility] PREP Location   [facility] PRE[facility] Location [facility]      Weekly Session   Topic Discussed Expectations and non-scale victories   Prep class talk about positive wins with none-scale victories   Minutes exercised this week 195 minutes    Classes attended to date 13          Jerona Irving 08/14/2024, 2:42 PM

## 2024-08-16 ENCOUNTER — Encounter: Payer: Self-pay | Admitting: Physician Assistant

## 2024-08-16 MED ORDER — FLUOCINOLONE ACETONIDE 0.01 % OT OIL
5.0000 [drp] | TOPICAL_OIL | Freq: Every day | OTIC | 0 refills | Status: AC
Start: 2024-08-16 — End: 2024-08-23

## 2024-08-16 NOTE — Telephone Encounter (Signed)
 Please see pt msg and advise

## 2024-08-16 NOTE — Patient Instructions (Signed)
  VISIT SUMMARY: You came in for a regular follow-up visit. We discussed your blood pressure, cholesterol, blood sugar, urinary urgency, tinnitus, ear dryness, sleep apnea, and ankle swelling. We also reviewed your exercise progress and dietary habits.  YOUR PLAN: BILATERAL LOWER EXTREMITY EDEMA: You have swelling in your ankles, which is likely related to your diet and lifestyle. -Limit your salt intake. -Wear compression socks to help reduce the swelling.  URINARY URGENCY AND STRESS INCONTINENCE: You are experiencing urinary urgency and stress incontinence. -You have an upcoming appointment with a urogynecologist for further evaluation and management.  TINNITUS AND UNSPECIFIED HEARING LOSS: You have tinnitus and sometimes hear music through vibrations. You did not pass the hearing screen, indicating potential hearing loss. -You will be referred to audiology for further evaluation.  EAR CANAL DERMATITIS (ECZEMA): You have dry skin and flakiness in your ear canal, likely due to eczema. -A steroid lotion will be prescribed for your ear canal.  HYPERLIPIDEMIA: You have high cholesterol. -A lipid panel will be ordered to evaluate your cholesterol levels.  HYPERGLYCEMIA: You have slightly elevated blood sugar levels. -An A1c test will be ordered to monitor your blood sugar levels.  VITAMIN D  DEFICIENCY: You have a history of vitamin D  deficiency and are currently taking supplements. -A vitamin D  level test will be ordered to check your current levels.  OBSTRUCTIVE SLEEP APNEA, MILD TO MODERATE: You have mild to moderate obstructive sleep apnea diagnosed in 2022 and are not currently using any treatment. -Monitor your symptoms and let us  know if your fatigue worsens.                      Contains text generated by Abridge.                                 Contains text generated by Abridge.

## 2024-08-17 ENCOUNTER — Other Ambulatory Visit: Payer: Self-pay

## 2024-08-17 DIAGNOSIS — D709 Neutropenia, unspecified: Secondary | ICD-10-CM

## 2024-08-17 MED ORDER — ATORVASTATIN CALCIUM 10 MG PO TABS: 10.0000 mg | ORAL_TABLET | Freq: Every day | ORAL | 0 refills | Status: DC

## 2024-08-17 NOTE — Telephone Encounter (Signed)
 Called pt and advised all PCP recommendations, future orders placed. Pt calling to schedule lab only appt and 6 mon f/u with PCP

## 2024-08-21 NOTE — Progress Notes (Signed)
 [facility] PRE[facility] Weekly Session  Patient Details  Name: Chelsea Long MRN: [id number] Date of Birth: 08-13-1956 Age: 68 y.o. PCP: Kathrene Mardy HERO, PA-C  Vitals:   08/21/24 1412  Weight: 185 lb (83.9 kg)     [facility] Weekly seesion - 08/21/24 1400       [facility] PREP Location   [facility] PRE[facility] Location [facility]      Weekly Session   Topic Discussed Other   Portion Size Matter and showed the what portion size looks like   Minutes exercised this week 229 minutes          Jerona Irving 08/21/2024, 2:15 PM

## 2024-08-22 ENCOUNTER — Encounter: Payer: Self-pay | Admitting: Physician Assistant

## 2024-08-22 NOTE — Telephone Encounter (Signed)
 Please see pt questions regarding Calcium intake and advise

## 2024-08-24 ENCOUNTER — Ambulatory Visit: Admitting: Obstetrics and Gynecology

## 2024-08-24 ENCOUNTER — Encounter: Payer: Self-pay | Admitting: Physician Assistant

## 2024-08-24 ENCOUNTER — Encounter: Payer: Self-pay | Admitting: Obstetrics and Gynecology

## 2024-08-24 ENCOUNTER — Ambulatory Visit: Admitting: Obstetrics

## 2024-08-24 ENCOUNTER — Other Ambulatory Visit (HOSPITAL_COMMUNITY)
Admission: RE | Admit: 2024-08-24 | Discharge: 2024-08-24 | Disposition: A | Discharge status: Home / Self Care | Source: Ambulatory Visit | Attending: Obstetrics and Gynecology | Admitting: Obstetrics and Gynecology

## 2024-08-24 VITALS — BP 116/77 | HR 105 | Ht 64.57 in | Wt 183.8 lb

## 2024-08-24 DIAGNOSIS — N3281 Overactive bladder: Secondary | ICD-10-CM | POA: Diagnosis not present

## 2024-08-24 DIAGNOSIS — N952 Postmenopausal atrophic vaginitis: Secondary | ICD-10-CM | POA: Diagnosis not present

## 2024-08-24 DIAGNOSIS — R350 Frequency of micturition: Secondary | ICD-10-CM | POA: Diagnosis not present

## 2024-08-24 DIAGNOSIS — K5904 Chronic idiopathic constipation: Secondary | ICD-10-CM

## 2024-08-24 DIAGNOSIS — M6289 Other specified disorders of muscle: Secondary | ICD-10-CM

## 2024-08-24 DIAGNOSIS — N898 Other specified noninflammatory disorders of vagina: Secondary | ICD-10-CM | POA: Insufficient documentation

## 2024-08-24 LAB — POCT URINALYSIS DIP (CLINITEK)
Bilirubin, UA: NEGATIVE
Blood, UA: NEGATIVE
Glucose, UA: NEGATIVE mg/dL
Ketones, POC UA: NEGATIVE mg/dL
Leukocytes, UA: NEGATIVE
Nitrite, UA: NEGATIVE
POC PROTEIN,UA: NEGATIVE
Spec Grav, UA: 1.015 (ref 1.010–1.025)
Urobilinogen, UA: 0.2 U/dL
pH, UA: 7.5 (ref 5.0–8.0)

## 2024-08-24 MED ORDER — ESTRADIOL 0.01 % VA CREA: 0.5000 g | TOPICAL_CREAM | VAGINAL | 11 refills | Status: AC

## 2024-08-24 NOTE — Patient Instructions (Addendum)
 I have placed a referral for physical therapy.   Please consider pumpkin seed extract up to 5gm daily can be effective, you do not want to get anything with saw palmetto   For your bowels, you can do 2 kiwis per day to assist in bowel movement regulation. Also would suggest a squatty potty.   Power Pudding is a natural mixture that may help your constipation.  To make blend 1 cup applesauce, 1 cup wheat bran, and 3/4 cup prune juice, refrigerate and then take 1 tablespoon daily with a large glass of water as needed.   You can consider doing the PTNS for a natural options to decrease OAB.   Today we talked about ways to manage bladder urgency such as altering your diet to avoid irritative beverages and foods (bladder diet) as well as attempting to decrease stress and other exacerbating factors.  You can also chew a plain Tums 1-3 times per day to make your urine less acidic, especially if you have eating/drinking acidic things.   The Most Bothersome Foods* The Least Bothersome Foods*  Coffee - Regular & Decaf Tea - caffeinated Carbonated beverages - cola, non-colas, diet & caffeine-free Alcohols - Beer, Red Wine, White Wine, [address] - Grapefruit, [place], Orange, [employer] - Cranberry, Grapefruit, Orange, Pineapple Vegetables - Tomato & Tomato Products Flavor Enhancers - Hot peppers, Spicy foods, Chili, Horseradish, Vinegar, Monosodium glutamate (MSG) Artificial Sweeteners - NutraSweet, Sweet 'N Low, Equal (sweetener), Saccharin Ethnic foods - Mexican, Thai, Indian food [employer] - low-fat & whole Fruits - Bananas, Blueberries, Honeydew melon, Pears, Raisins, Watermelon Vegetables - Broccoli, [address] Sprouts, [place], Carrots, Cauliflower, [place], Cucumber, Mushrooms, Peas, Radishes, Squash, Zucchini, White potatoes, Sweet potatoes & yams Poultry - Chicken, Eggs, Turkey, [employer] - Beef, [employer]ational [employer], Lamb Seafood - Shrimp, [place] fish, Salmon Grains - Oat, Rice Snacks -  Pretzels, Popcorn  *Mitch ALF et al. Diet and its role in interstitial cystitis/bladder pain syndrome (IC/BPS) and comorbid conditions. [employer]. BJU Int. 2012 Jan 11.

## 2024-08-24 NOTE — Progress Notes (Signed)
 [facility] New Patient Evaluation and Consultation  Referring Provider: Allwardt, Mardy HERO, PA-C PCP: Allwardt, Mardy HERO, PA-C Date of Service: 08/24/2024  SUBJECTIVE Chief Complaint: New Patient (Initial Visit) Chelsea Long is a 68 y.o. female here today for OAB.)  History of Present Illness: Chelsea Long is a 68 y.o. White or Caucasian female seen in consultation at the request of Dr. Kathrene for evaluation of urgency/leakage.    Review of records significant for: Zoster of the cornea, no glaucoma No hx of BP issues  Urinary Symptoms: Leaks urine with with a full bladder, with movement to the bathroom, and with urgency Leaks A few times per day Pad use: 2 pads per day.   Patient is bothered by UI symptoms.  Day time voids 8.  Nocturia: 1-4 times per night to void. Voiding dysfunction:  does not empty bladder well.  Patient does not use a catheter to empty bladder.  When urinating, patient feels a weak stream, dribbling after finishing, and the need to urinate multiple times in a row Drinks: 2 cups coffee AM, 32oz Water, 40oz Propel (Blackberry) per day  UTIs: 0 UTI's in the last year.   Denies history of urologic concerns.  No results found for the last 90 days.   Pelvic Organ Prolapse Symptoms:                  Patient Denies a feeling of a bulge the vaginal area.   Bowel Symptom: Bowel movements: 1-3 time(s) per week Stool consistency: soft  Straining: yes.  Splinting: no.  Incomplete evacuation: yes.  Patient Denies accidental bowel leakage / fecal incontinence Bowel regimen: none Last colonoscopy: Date March 2025, Results 2 precancerous polyps [facility] Colonoscopy          Upcoming     Colonoscopy (Every 7 Years) Next due on 01/05/2031    01/05/2024  COLONOSCOPY   Only the first 1 history entries have been loaded, but more history exists.                Sexual Function Sexually active: no.  Sexual orientation: Straight Pain with  sex: No  Pelvic Pain Denies pelvic pain    Past Medical History:  Past Medical History:  Diagnosis Date   Anxiety 2012   Chicken pox    Herpes zoster of eye    Migraines    Preventative health care 01/02/2021   Sleep apnea 2019   Tinnitus    Vitamin D  deficiency    Welcome to Medicare preventive visit 03/06/2022     Past Surgical History:   Past Surgical History:  Procedure Laterality Date   ABLATION  2010   Uterine   APPENDECTOMY  1978   CHOLECYSTECTOMY, LAPAROSCOPIC  2006     Past OB/GYN History: G2P2002 Vaginal deliveries: 2,  Forceps/ Vacuum deliveries: 0, Cesarean section: 0 Menopausal: Yes, at age 89 Contraception: None. Last pap smear was Age 30.  Any history of abnormal pap smears: no. [facility] PAP   This patient has no relevant Health Maintenance data.     Medications: Patient has a current medication list which includes the following prescription(s): atorvastatin, calcium carbonate-vit d-min, estradiol, magnesium, multiple vitamins-minerals, valacyclovir, and vitamin d .   Allergies: Patient is allergic to codeine, erythromycin, gabapentin, hydroquinidine, and tobramycin.   Social History:  Social History   Tobacco Use   Smoking status: Never   Smokeless tobacco: Never  Vaping Use   Vaping status: Never Used  Substance Use Topics   Alcohol  use: Yes    Comment: socially   Drug use: Never    Relationship status: widowed Patient lives alone.   Patient is not employed. Regular exercise: Yes: cardio and strength training History of abuse: No  Family History:   Family History  Problem Relation Age of Onset   Dementia Mother    Breast cancer Mother 49   Stroke Father 37   Hypertension Father    Heart disease Father    Hyperlipidemia Father    Diabetes Father    Cancer Maternal Grandmother    Cancer Maternal Grandfather    Colon cancer Neg Hx    Colon polyps Neg Hx    Esophageal cancer Neg Hx    Rectal cancer Neg Hx    Stomach cancer Neg  Hx    Bladder Cancer Neg Hx    Renal cancer Neg Hx    Uterine cancer Neg Hx      Review of Systems: Review of Systems  Constitutional:  Negative for chills and fever.  Respiratory:  Negative for cough and shortness of breath.   Cardiovascular:  Positive for leg swelling. Negative for chest pain and palpitations.  Gastrointestinal:  Negative for abdominal pain, blood in stool, constipation and diarrhea.  Skin:  Negative for rash.  Neurological:  Negative for weakness.  Endo/Heme/Allergies:  Bruises/bleeds easily.  Psychiatric/Behavioral:  Negative for depression and suicidal ideas.      OBJECTIVE Physical Exam: Vitals:   08/24/24 0835  BP: 116/77  Pulse: (!) 105  Weight: 183 lb 12.8 oz (83.4 kg)  Height: 5' 4.57 (1.64 m)    Physical Exam Constitutional:      Appearance: Normal appearance.  Pulmonary:     Effort: Pulmonary effort is normal.  Abdominal:     Palpations: Abdomen is soft.  Neurological:     General: No focal deficit present.     Mental Status: She is alert and oriented to person, place, and time.  Psychiatric:        Mood and Affect: Mood normal.        Behavior: Behavior normal. Behavior is cooperative.        Thought Content: Thought content normal.      GU / Detailed Urogynecologic Evaluation:  Pelvic Exam: Shiny areas of white at the perineum and on the right labia; Bartholin's and Skene's glands normal in appearance; urethral meatus normal in appearance, no urethral masses or discharge.   CST: negative  Speculum exam reveals normal vaginal mucosa with atrophy. Cervix normal appearance. Uterus normal single, nontender. Adnexa normal adnexa.    Pelvic floor strength I/V, puborectalis I/V   Pelvic floor musculature: Right levator non-tender, Right obturator tender, Left levator non-tender, Left obturator non-tender  POP-Q:   POP-Q  -3                                            Aa   -3                                           Ba  -8  C   3                                            Gh  5                                            Pb  9.5                                            tvl   -3                                            Ap  -3                                            Bp  -9                                              D      Rectal Exam:  Normal external exam  Post-Void Residual (PVR) by Bladder Scan: In order to evaluate bladder emptying, we discussed obtaining a postvoid residual and patient agreed to this procedure.  Procedure: The ultrasound unit was placed on the patient's abdomen in the suprapubic region after the patient had voided.    Post Void Residual - 08/24/24 0854       Post Void Residual   Post Void Residual 8 mL           Laboratory Results: Lab Results  Component Value Date   COLORU yellow 08/24/2024   CLARITYU clear 08/24/2024   GLUCOSEUR negative 08/24/2024   BILIRUBINUR negative 08/24/2024   SPECGRAV 1.015 08/24/2024   RBCUR negative 08/24/2024   PHUR 7.5 08/24/2024   UROBILINOGEN 0.2 08/24/2024   LEUKOCYTESUR Negative 08/24/2024    Lab Results  Component Value Date   CREATININE 0.78 08/14/2024   CREATININE 0.95 03/18/2023   CREATININE 0.86 03/06/2022    Lab Results  Component Value Date   HGBA1C 5.9 08/14/2024    Lab Results  Component Value Date   HGB 14.0 08/14/2024     ASSESSMENT AND PLAN Ms. Coffel is a 68 y.o. with:  1. Vaginal itching   2. Urinary frequency   3. OAB (overactive bladder)   4. Pelvic floor dysfunction   5. Vaginal atrophy   6. Chronic idiopathic constipation    Aptima swab obtained to rule out candida glabrata.  We discussed the symptoms of overactive bladder (OAB), which include urinary urgency, urinary frequency, nocturia, with or without urge incontinence.  While we do not know the exact etiology of OAB, several treatment options exist. We discussed management including  behavioral therapy (decreasing bladder irritants, urge suppression strategies, timed voids, bladder retraining), physical therapy, medication; for refractory cases posterior tibial nerve stimulation, sacral neuromodulation, and  intravesical botulinum toxin injection. For anticholinergic medications, we discussed the potential side effects of anticholinergics including dry eyes, dry mouth, constipation, cognitive impairment and urinary retention. For Beta-3 agonist medication, we discussed the potential side effect of elevated blood pressure which is more likely to occur in individuals with uncontrolled hypertension.  Patient would prefer to start in a more natural direction. She is considering pelvic floor PT, referral placed. She is considering PTNS as well. Information given on PTNS. We also discussed using pumpkin seed extract up to 5gm daily as this has been shown to be effective in reduction of OAB spasms.  Referral placed for pelvic floor PT.  Patient has vaginal atrophy on exam. She would benefit from estrogen cream. Patient to use a blueberry sized amount into the vagina. She may use this nightly for 2 weeks and then twice weekly after. We discussed using her finger instead of using the applicator.  We discussed different options for managing constipation and the impact on OAB and pelvic floor. We discussed doing 2 kiwi per day, power pudding, and squatty potty. She will consider adding these to her regimen.   Patient will call if she would like to do PTNS   Kaylea Mounsey G Jaziah Goeller, NP

## 2024-08-25 LAB — CERVICOVAGINAL ANCILLARY ONLY
Bacterial Vaginitis (gardnerella): NEGATIVE
Candida Glabrata: NEGATIVE
Candida Vaginitis: NEGATIVE
Comment: NEGATIVE
Comment: NEGATIVE
Comment: NEGATIVE

## 2024-08-28 ENCOUNTER — Ambulatory Visit: Payer: Self-pay | Admitting: Obstetrics and Gynecology

## 2024-08-29 ENCOUNTER — Encounter: Payer: Self-pay | Admitting: Obstetrics and Gynecology

## 2024-08-29 NOTE — Telephone Encounter (Signed)
 Pt will complete at their 6 mo fu

## 2024-09-07 NOTE — Progress Notes (Signed)
 Chelsea Long withdrew from PREP Class on 09/06/24 because of family conflicts and travel.

## 2024-10-30 ENCOUNTER — Ambulatory Visit: Admitting: Physical Therapy

## 2024-11-16 ENCOUNTER — Telehealth: Payer: Self-pay | Admitting: Physician Assistant

## 2024-11-16 ENCOUNTER — Other Ambulatory Visit: Payer: Self-pay | Admitting: Physician Assistant

## 2024-11-16 NOTE — Telephone Encounter (Signed)
 Refill sent 11/16/2024.   Copied from CRM #[id number]. Topic: Clinical - Medication Question >> Nov 16, 2024  8:24 AM Robinson H wrote: Reason for CRM: Patient stating she is almost out of her atorvastatin  (LIPITOR) 10 MG tablet has enough until Monday, states she put in a request at pharmacy but wants to know if refill can be expedited. Agent not seeing any request from pharmacy patient states she submitted request through automated system  Danae [phone]

## 2024-11-22 ENCOUNTER — Other Ambulatory Visit: Payer: Self-pay

## 2024-11-22 ENCOUNTER — Ambulatory Visit: Attending: Obstetrics and Gynecology

## 2024-11-22 DIAGNOSIS — R3915 Urgency of urination: Secondary | ICD-10-CM | POA: Diagnosis present

## 2024-11-22 DIAGNOSIS — M6281 Muscle weakness (generalized): Secondary | ICD-10-CM | POA: Diagnosis present

## 2024-11-22 DIAGNOSIS — R279 Unspecified lack of coordination: Secondary | ICD-10-CM | POA: Diagnosis present

## 2024-11-22 DIAGNOSIS — N3941 Urge incontinence: Secondary | ICD-10-CM | POA: Diagnosis present

## 2024-11-22 DIAGNOSIS — R293 Abnormal posture: Secondary | ICD-10-CM | POA: Insufficient documentation

## 2024-11-22 NOTE — Progress Notes (Signed)
 " OUTPATIENT PHYSICAL THERAPY FEMALE PELVIC EVALUATION   Patient Name: Chelsea Long MRN: [id number] DOB:[birth year], 69 y.o., female Today's Date: 11/22/2024  END OF SESSION:  PT End of Session - 11/22/24 1617     Visit Number 1    Date for Recertification  02/14/25    Authorization Type Medicare    Progress Note Due on Visit 10    PT Start Time 1615    PT Stop Time 1655    PT Time Calculation (min) 40 min    Activity Tolerance Patient tolerated treatment well    Behavior During Therapy [facility] for tasks assessed/performed          Past Medical History:  Diagnosis Date   Anxiety 2012   Chicken pox    Herpes zoster of eye    Migraines    Preventative health care 01/02/2021   Sleep apnea 2019   Tinnitus    Vitamin D  deficiency    Welcome to Medicare preventive visit 03/06/2022   Past Surgical History:  Procedure Laterality Date   ABLATION  2010   Uterine   APPENDECTOMY  1978   CHOLECYSTECTOMY, LAPAROSCOPIC  2006   Patient Active Problem List   Diagnosis Date Noted   Class 1 obesity due to excess calories without serious comorbidity with body mass index (BMI) of 31.0 to 31.9 in adult 05/11/2024   History of herpes zoster of eye 03/18/2023   GAD (generalized anxiety disorder) 05/08/2022   Hyperlipidemia 03/06/2022   OSA (obstructive sleep apnea) 01/02/2021   Idiopathic peripheral neuropathy 12/05/2019   Vitamin D  deficiency 12/05/2019   Tinnitus 12/05/2019   Vaginal itching 12/05/2019    PCP: Allwardt, Mardy HERO, PA-C   REFERRING PROVIDER: Zuleta, Kaitlin G, NP   REFERRING DIAG: R35.0 (ICD-10-CM) - Urinary frequency M62.89 (ICD-10-CM) - Pelvic floor dysfunction N32.81 (ICD-10-CM) - OAB (overactive bladder) N95.2 (ICD-10-CM) - Vaginal atrophy  THERAPY DIAG:  Muscle weakness (generalized)  Unspecified lack of coordination  Abnormal posture  Urinary urgency  Urge incontinence  Rationale for Evaluation and Treatment: Rehabilitation  ONSET DATE: 1 year    SUBJECTIVE:                                                                                                                                                                                           SUBJECTIVE STATEMENT: Pt states that she is been having urinary urgency that has worsened in the last year.    PAIN:  Are you having pain? No   PRECAUTIONS: None  RED FLAGS: None   WEIGHT BEARING RESTRICTIONS: No  FALLS:  Has patient fallen in last 6 months? 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   ACTIVITY LEVEL : inconsistent - walking, nu-step, dancing videos, weight training  PLOF: Independent  PATIENT GOALS: to stop leakage and urgency  PERTINENT HISTORY:  Uterine ablation 2010, appendectomy, cholecystectomy, sleep apnea, hemorrhoids  Sexual abuse: No  BOWEL MOVEMENT: Pain with bowel movement: No Type of bowel movement:Frequency every day to every other day and Strain yes, sometimes  Fully empty rectum: No Leakage: No Urgency: Yes: once and a while, but doesn't worry about this  Pads: Yes: see below Fiber supplement/laxative trying to eat good diet and does better, if she does not do as well with diet she gets more constipated   URINATION: Pain with urination: No Fully empty bladder: Yes: will post void dribble  Stream: Strong Urgency: Yes  Frequency: every 2 hours Nocturia: at least once, sometimes up to 4-5x  Fluid Intake: 2 cups of coffee in the morning, during the day she does water and propel Leakage: Urge to void and Walking to the bathroom Pads: Yes: 2 a day  INTERCOURSE:  Not sexually active in 6 years after husband passed   PREGNANCY: Vaginal deliveries 2 Tearing No Episiotomy No C-section deliveries 0 Currently pregnant No  PROLAPSE: None   OBJECTIVE:  Note: Objective measures were completed at Evaluation unless otherwise noted.  11/22/24 PATIENT SURVEYS:   UIQ-7: 52  COGNITION: Overall cognitive status: Within functional limits  for tasks assessed     SENSATION: Light touch: Appears intact   FUNCTIONAL TESTS:  Squat: WNL Single leg stance:  Rt: stable  Lt: pelvic drop Curl-up test: midline distortion throughout    GAIT: Assistive device utilized: None Comments: WNL  POSTURE: rounded shoulders, forward head, decreased lumbar lordosis, posterior pelvic tilt, and possible small Rt lumbar curvature, Rt posterior rotation    LUMBARAROM/PROM:  A/PROM A/PROM  Eval (% available)  Flexion 50  Extension 50  Right lateral flexion 75  Left lateral flexion 75  Right rotation 50  Left rotation 25   (Blank rows = not tested)  PALPATION: General: increased Rt lumbar paraspinal tone  Abdominal: Sternocostal angle: significant increase Breathing: apical Tenderness: pubic symphysis Scar tissue: Rt lower quadrant Diastasis: 3 finger widths right above umbilicus                External Perineal Exam: dryness, white plaques present in inner bil labia minora with bright red areas as well - she was tested for lichens sclerosis and states that test was negative                             Internal Pelvic Floor: no tenderness, mild dryness, no stenosis   Patient confirms identification and approves PT to assess internal pelvic floor and treatment Yes  PELVIC MMT:   MMT eval  Vaginal 1/5, 3 second hold, 4 repeat contractions  (Blank rows = not tested)        TONE: WNL  PROLAPSE: Mild anterior/posterior vaginal wall laxity  TODAY'S TREATMENT:  DATE:  11/22/24 EVAL  Neuromuscular re-education: Pt provides verbal consent for internal vaginal/rectal pelvic floor exam. Internal vaginal pelvic floor muscle contraction training Quick flicks Long holds Urge drill  Therapeutic activities: Double voiding Vaginal moisturizers - samples given Encouraged to follow MD instructions for  estrogen cream use - reviewed referring providers note to confirm instructions for use    PATIENT EDUCATION:  Education details: See above Person educated: Patient Education method: Explanation, Demonstration, Tactile cues, Verbal cues, and Handouts Education comprehension: verbalized understanding  HOME EXERCISE PROGRAM: [id number]  ASSESSMENT:  CLINICAL IMPRESSION: Patient is a 69 y.o. female who was seen today for physical therapy evaluation and treatment for urinary urgency and incontinence. Exam findings notable for decreased lumbar A/ROM, abnormal posture, pelvic drop in single leg stance, core weakness, pelvic floor muscle weakness, decreased pelvic floor muscle endurance and coordination, anterior vaginal wall laxity, and poor pressure management.  Signs and symptoms are most consistent with pelvic floor muscle weakness, decreased coordination, and poor pressure management. Initial treatment consisted of pelvic floor muscle contraction training and the urge drill. She will continue to benefit from skilled PT intervention in order to decrease urinary urgency and incontinence, address impairments, and improve quality of life.   OBJECTIVE IMPAIRMENTS: decreased activity tolerance, decreased coordination, decreased endurance, decreased mobility, decreased ROM, decreased strength, increased fascial restrictions, increased muscle spasms, impaired flexibility, impaired tone, improper body mechanics, postural dysfunction, and pain.   ACTIVITY LIMITATIONS: continence  PARTICIPATION LIMITATIONS: community activity and childcare activities  PERSONAL FACTORS: 3+ comorbidities: medical history are also affecting patient's functional outcome.   REHAB POTENTIAL: Good  CLINICAL DECISION MAKING: Stable/uncomplicated  EVALUATION COMPLEXITY: Low   GOALS: Goals reviewed with patient? Yes  SHORT TERM GOALS: Target date: 12/20/2024   Pt will be independent with HEP in order to improve activity  tolerance.   Baseline: Goal status: INITIAL  2.  Pt will be independent with the urge suppression technique and double voiding in order to improve bladder habits and decrease urinary incontinence.    Baseline:  Goal status: INITIAL  3.  Patient will report 25% improvement in urinary incontinence in order to decrease pad use, risk of infection, and quality of life.    Baseline:  Goal status: INITIAL  4.  Pt will be independent with daily vaginal moisturizing in order to improve vulvar tissue health.  Baseline:  Goal status: INITIAL   LONG TERM GOALS: Target date: 02/14/2025  Pt will be independent with advanced HEP in order to improve activity tolerance.   Baseline:  Goal status: INITIAL  2.  Patient will report 75% improvement in urinary incontinence in order to decrease pad use, decrease risk of infection, and improve quality of life.   Baseline:  Goal status: INITIAL  3.  Pt will be able to go 2-3 hours in between voids without urgency or incontinence in order to improve QOL and perform all functional activities with less difficulty.   Baseline:  Goal status: INITIAL  4.  Pt will reduce UIQ-7 score to less than 15 in order to demonstrate less functional impact from urinary urgency and incontinence on quality of life.  Baseline: 52 Goal status: INITIAL  5.  Pt will report use of 0 pads a day in order to decrease risk of infection.  Baseline: 2 pads/day Goal status: INITIAL   6.  Pt will demonstrate normal pelvic floor muscle tone and A/ROM, able to achieve 3/5 strength with contractions and 10 sec endurance, in order to reduce urinary leaking and  number of pads patient wears.   Baseline:  Goal status: INITIAL  PLAN:  PT FREQUENCY: 1-2x/week  PT DURATION: 10 visits    PLANNED INTERVENTIONS: 97164- PT Re-evaluation, 97110-Therapeutic exercises, 97530- Therapeutic activity, 97112- Neuromuscular re-education, 97535- Self Care, 02859- Manual therapy, [phone]- Gait  training, [phone]- Aquatic Therapy, [phone]- Electrical stimulation (unattended), [phone]- Traction (mechanical), [id number]- Ionotophoresis 4mg /ml Dexamethasone, 20560 (1-2 muscles), 20561 (3+ muscles)- Dry Needling, Patient/Family education, Balance training, Taping, Joint mobilization, Joint manipulation, Spinal manipulation, Spinal mobilization, Scar mobilization, Vestibular training, Cryotherapy, Moist heat, and Biofeedback  PLAN FOR NEXT SESSION: core training  Josette Mares, PT, DPT01/28/265:12 PM [facility] [address] [place], [place] [address] Phone # [phone] Fax [phone]   "

## 2024-11-22 NOTE — Patient Instructions (Signed)
° °  Urge Drill  When you feel an urge to go, follow these steps to regain control: Stop what you are doing and be still Take one deep breath, directing your air into your abdomen Think an affirming thought, such as I've got this. Do 5 quick flicks of your pelvic floor Walk with control to the bathroom to void, or delay voiding    Double-voiding:  This technique is to help with post-void dribbling, or leaking a little bit when you stand up right after urinating. Use relaxed toileting mechanics to urinate as much as you feel like you have to without straining. Sit back upright from leaning forward and relax this way for 10-20 seconds. Lean forward again to finish voiding any amount more.    [facility] [address] [place], [place] [address] Phone # [phone] Fax [phone]

## 2024-11-23 ENCOUNTER — Ambulatory Visit: Payer: Self-pay | Admitting: Primary Care

## 2024-11-23 ENCOUNTER — Ambulatory Visit
Admission: RE | Admit: 2024-11-23 | Discharge: 2024-11-23 | Disposition: A | Discharge status: Home / Self Care | Source: Ambulatory Visit | Attending: Primary Care | Admitting: Primary Care

## 2024-11-23 DIAGNOSIS — N6489 Other specified disorders of breast: Secondary | ICD-10-CM

## 2025-01-01 ENCOUNTER — Ambulatory Visit

## 2025-01-08 ENCOUNTER — Ambulatory Visit

## 2025-01-15 ENCOUNTER — Ambulatory Visit

## 2025-01-18 ENCOUNTER — Ambulatory Visit

## 2025-02-12 ENCOUNTER — Ambulatory Visit: Admitting: Physician Assistant

## 2025-07-11 ENCOUNTER — Ambulatory Visit

## 2025-08-23 ENCOUNTER — Ambulatory Visit: Admitting: Physician Assistant
